# Patient Record
Sex: Male | Born: 1955
Health system: Southern US, Community
[De-identification: ages and names within clinical notes are randomized; demographics above are authoritative.]

## PROBLEM LIST (undated history)

## (undated) DIAGNOSIS — I2510 Atherosclerotic heart disease of native coronary artery without angina pectoris: Secondary | ICD-10-CM

## (undated) DIAGNOSIS — G4733 Obstructive sleep apnea (adult) (pediatric): Secondary | ICD-10-CM

## (undated) DIAGNOSIS — I714 Abdominal aortic aneurysm, without rupture: Secondary | ICD-10-CM

## (undated) DIAGNOSIS — K219 Gastro-esophageal reflux disease without esophagitis: Secondary | ICD-10-CM

## (undated) DIAGNOSIS — K635 Polyp of colon: Secondary | ICD-10-CM

## (undated) DIAGNOSIS — E785 Hyperlipidemia, unspecified: Secondary | ICD-10-CM

## (undated) DIAGNOSIS — L0591 Pilonidal cyst without abscess: Secondary | ICD-10-CM

## (undated) DIAGNOSIS — K449 Diaphragmatic hernia without obstruction or gangrene: Secondary | ICD-10-CM

## (undated) DIAGNOSIS — G4730 Sleep apnea, unspecified: Secondary | ICD-10-CM

## (undated) DIAGNOSIS — E7800 Pure hypercholesterolemia, unspecified: Secondary | ICD-10-CM

## (undated) DIAGNOSIS — F1011 Alcohol abuse, in remission: Secondary | ICD-10-CM

## (undated) DIAGNOSIS — J45909 Unspecified asthma, uncomplicated: Secondary | ICD-10-CM

## (undated) DIAGNOSIS — I701 Atherosclerosis of renal artery: Secondary | ICD-10-CM

## (undated) DIAGNOSIS — F1010 Alcohol abuse, uncomplicated: Secondary | ICD-10-CM

## (undated) HISTORY — DX: Obstructive sleep apnea (adult) (pediatric): G47.33

## (undated) HISTORY — DX: Gastro-esophageal reflux disease without esophagitis: K21.9

## (undated) HISTORY — DX: Hyperlipidemia, unspecified: E78.5

## (undated) HISTORY — DX: Sleep apnea, unspecified: G47.30

## (undated) HISTORY — DX: Polyp of colon: K63.5

## (undated) HISTORY — DX: Abdominal aortic aneurysm, without rupture: I71.4

## (undated) HISTORY — DX: Alcohol abuse, in remission: F10.11

## (undated) HISTORY — DX: Atherosclerotic heart disease of native coronary artery without angina pectoris: I25.10

## (undated) HISTORY — DX: Alcohol abuse, uncomplicated: F10.10

## (undated) HISTORY — DX: Pure hypercholesterolemia, unspecified: E78.00

## (undated) HISTORY — PX: KNEE SURGERY: SHX244

## (undated) HISTORY — DX: Atherosclerosis of renal artery: I70.1

## (undated) HISTORY — DX: Pilonidal cyst without abscess: L05.91

## (undated) HISTORY — DX: Diaphragmatic hernia without obstruction or gangrene: K44.9

---

## 1999-05-31 ENCOUNTER — Emergency Department (HOSPITAL_COMMUNITY): Admission: EM | Admit: 1999-05-31 | Discharge: 1999-05-31 | Payer: Self-pay

## 1999-11-06 ENCOUNTER — Encounter: Payer: Self-pay | Admitting: Family Medicine

## 1999-11-06 ENCOUNTER — Encounter: Admission: RE | Admit: 1999-11-06 | Discharge: 1999-11-06 | Payer: Self-pay | Admitting: Family Medicine

## 2002-05-17 ENCOUNTER — Emergency Department (HOSPITAL_COMMUNITY): Admission: EM | Admit: 2002-05-17 | Discharge: 2002-05-17 | Payer: Self-pay | Admitting: Emergency Medicine

## 2002-07-23 ENCOUNTER — Ambulatory Visit (HOSPITAL_BASED_OUTPATIENT_CLINIC_OR_DEPARTMENT_OTHER): Admission: RE | Admit: 2002-07-23 | Discharge: 2002-07-23 | Payer: Self-pay | Admitting: Orthopedic Surgery

## 2003-09-04 HISTORY — PX: JOINT REPLACEMENT: SHX530

## 2004-01-26 ENCOUNTER — Inpatient Hospital Stay (HOSPITAL_COMMUNITY): Admission: RE | Admit: 2004-01-26 | Discharge: 2004-01-29 | Payer: Self-pay | Admitting: Orthopedic Surgery

## 2013-08-10 ENCOUNTER — Telehealth: Payer: Self-pay | Admitting: *Deleted

## 2013-08-10 NOTE — Telephone Encounter (Signed)
Pt request 2nd pair of orthotics.  Please call.

## 2013-08-18 DIAGNOSIS — M722 Plantar fascial fibromatosis: Secondary | ICD-10-CM

## 2013-08-19 NOTE — Telephone Encounter (Signed)
Pt paid $275.00 for 2nd pair of orthotics on Dec 16th. I ordered them today.

## 2016-11-09 DIAGNOSIS — E7800 Pure hypercholesterolemia, unspecified: Secondary | ICD-10-CM | POA: Diagnosis not present

## 2016-11-09 DIAGNOSIS — Z23 Encounter for immunization: Secondary | ICD-10-CM | POA: Diagnosis not present

## 2016-11-09 DIAGNOSIS — Z0000 Encounter for general adult medical examination without abnormal findings: Secondary | ICD-10-CM | POA: Diagnosis not present

## 2016-11-09 DIAGNOSIS — Z125 Encounter for screening for malignant neoplasm of prostate: Secondary | ICD-10-CM | POA: Diagnosis not present

## 2016-11-09 DIAGNOSIS — L309 Dermatitis, unspecified: Secondary | ICD-10-CM | POA: Diagnosis not present

## 2016-11-12 ENCOUNTER — Other Ambulatory Visit: Payer: Self-pay | Admitting: Acute Care

## 2016-11-12 DIAGNOSIS — F17210 Nicotine dependence, cigarettes, uncomplicated: Secondary | ICD-10-CM

## 2016-11-26 ENCOUNTER — Ambulatory Visit (INDEPENDENT_AMBULATORY_CARE_PROVIDER_SITE_OTHER): Payer: Commercial Managed Care - PPO | Admitting: Acute Care

## 2016-11-26 ENCOUNTER — Encounter: Payer: Self-pay | Admitting: Acute Care

## 2016-11-26 ENCOUNTER — Ambulatory Visit (INDEPENDENT_AMBULATORY_CARE_PROVIDER_SITE_OTHER)
Admission: RE | Admit: 2016-11-26 | Discharge: 2016-11-26 | Disposition: A | Discharge status: Home / Self Care | Payer: Commercial Managed Care - PPO | Source: Ambulatory Visit | Attending: Acute Care | Admitting: Acute Care

## 2016-11-26 DIAGNOSIS — F17210 Nicotine dependence, cigarettes, uncomplicated: Secondary | ICD-10-CM

## 2016-11-26 DIAGNOSIS — Z87891 Personal history of nicotine dependence: Secondary | ICD-10-CM

## 2016-11-26 DIAGNOSIS — Z136 Encounter for screening for cardiovascular disorders: Secondary | ICD-10-CM | POA: Diagnosis not present

## 2016-11-26 DIAGNOSIS — E871 Hypo-osmolality and hyponatremia: Secondary | ICD-10-CM | POA: Diagnosis not present

## 2016-11-26 DIAGNOSIS — E7800 Pure hypercholesterolemia, unspecified: Secondary | ICD-10-CM | POA: Diagnosis not present

## 2016-11-26 NOTE — Progress Notes (Signed)
Shared Decision Making Visit Lung Cancer Screening Program [phone])   Eligibility:  Age 61 y.o.  Pack Years Smoking History Calculation 43-pack-year smoking history (# packs/per year x # years smoked)  Recent History of coughing up blood  no  Unexplained weight loss? no ( >Than 15 pounds within the last 6 months )  Prior History Lung / other cancer no (Diagnosis within the last 5 years already requiring surveillance chest CT Scans).  Smoking Status Current Smoker  Former Smokers: Years since quit:NA  Quit Date: NA  Visit Components:  Discussion included one or more decision making aids. yes  Discussion included risk/benefits of screening. yes  Discussion included potential follow up diagnostic testing for abnormal scans. yes  Discussion included meaning and risk of over diagnosis. yes  Discussion included meaning and risk of False Positives. yes  Discussion included meaning of total radiation exposure. yes  Counseling Included:  Importance of adherence to annual lung cancer LDCT screening. yes  Impact of comorbidities on ability to participate in the program. yes  Ability and willingness to under diagnostic treatment. yes  Smoking Cessation Counseling:  Current Smokers:   Discussed importance of smoking cessation. yes  Information about tobacco cessation classes and interventions provided to patient. yes  Patient provided with "ticket" for LDCT Scan. yes  Symptomatic Patient. no  Counseling  Diagnosis Code: Tobacco Use Z72.0  Asymptomatic Patient yes  Counseling (Intermediate counseling: > three minutes counseling) N2778  Former Smokers:   Discussed the importance of maintaining cigarette abstinence. yes  Diagnosis Code: Personal History of Nicotine Dependence. E42.353  Information about tobacco cessation classes and interventions provided to patient. Yes  Patient provided with "ticket" for LDCT Scan. yes  Written Order for Lung Cancer  Screening with LDCT placed in Epic. Yes (CT Chest Lung Cancer Screening Low Dose W/O CM) [id number] Z12.2-Screening of respiratory organs Z87.891-Personal history of nicotine dependence  I have spent 25 minutes of face to face time with Mr. Hubert discussing the risks and benefits of lung cancer screening. We viewed a power point together that explained in detail the above noted topics. We paused at intervals to allow for questions to be asked and answered to ensure understanding.We discussed that the single most powerful action that he can take to decrease his risk of developing lung cancer is to quit smoking. We discussed whether or not he is ready to commit to setting a quit date. He is currently not ready to set a quit date We discussed options for tools to aid in quitting smoking including nicotine replacement therapy, non-nicotine medications, support groups, Quit Smart classes, and behavior modification. We discussed that often times setting smaller, more achievable goals, such as eliminating 1 cigarette a day for a week and then 2 cigarettes a day for a week can be helpful in slowly decreasing the number of cigarettes smoked. This allows for a sense of accomplishment as well as providing a clinical benefit. I gave him the " Be Stronger Than Your Excuses" card with contact information for community resources, classes, free nicotine replacement therapy, and access to mobile apps, text messaging, and on-line smoking cessation help. I have also given him my card and contact information in the event he  needs to contact me. We discussed the time and location of the scan, and that either Doroteo Glassman, RN or I will call with the results within 24-48 hours of receiving them. I have provided him  with a copy of the power point we viewed  as  a resource in the event they need reinforcement of the concepts we discussed today in the office. The patient verbalized understanding of all of  the above and had no further  questions upon leaving the office. They have my contact information in the event they have any further questions.  I spent 4 minutes counseling patient regarding smoking cessation, and health risks of continued tobacco abuse.  We discussed that there has been a large incidence of coronary artery disease noted on these scans. I explained that we will share the results of the scan with his PCP, so that they can follow up as they feel is clinically indicated. Patient verbalized understanding of the above and had no further questions at completion of the visit.  Magdalen Spatz, NP 11/26/2016

## 2016-11-28 ENCOUNTER — Other Ambulatory Visit: Payer: Self-pay | Admitting: Acute Care

## 2016-11-28 DIAGNOSIS — F17210 Nicotine dependence, cigarettes, uncomplicated: Secondary | ICD-10-CM

## 2016-12-14 DIAGNOSIS — D126 Benign neoplasm of colon, unspecified: Secondary | ICD-10-CM | POA: Diagnosis not present

## 2016-12-14 DIAGNOSIS — Z86010 Personal history of colonic polyps: Secondary | ICD-10-CM | POA: Diagnosis not present

## 2016-12-14 DIAGNOSIS — K635 Polyp of colon: Secondary | ICD-10-CM | POA: Diagnosis not present

## 2016-12-19 DIAGNOSIS — J029 Acute pharyngitis, unspecified: Secondary | ICD-10-CM | POA: Diagnosis not present

## 2016-12-22 DIAGNOSIS — J029 Acute pharyngitis, unspecified: Secondary | ICD-10-CM | POA: Diagnosis not present

## 2016-12-28 ENCOUNTER — Emergency Department (HOSPITAL_COMMUNITY)
Admission: EM | Admit: 2016-12-28 | Discharge: 2016-12-28 | Disposition: A | Discharge status: Home / Self Care | Payer: Commercial Managed Care - PPO | Attending: Emergency Medicine | Admitting: Emergency Medicine

## 2016-12-28 ENCOUNTER — Emergency Department (HOSPITAL_COMMUNITY): Payer: Commercial Managed Care - PPO

## 2016-12-28 ENCOUNTER — Encounter (HOSPITAL_COMMUNITY): Payer: Self-pay | Admitting: Emergency Medicine

## 2016-12-28 DIAGNOSIS — R590 Localized enlarged lymph nodes: Secondary | ICD-10-CM | POA: Diagnosis not present

## 2016-12-28 DIAGNOSIS — F17200 Nicotine dependence, unspecified, uncomplicated: Secondary | ICD-10-CM | POA: Diagnosis not present

## 2016-12-28 DIAGNOSIS — J36 Peritonsillar abscess: Secondary | ICD-10-CM | POA: Insufficient documentation

## 2016-12-28 DIAGNOSIS — E041 Nontoxic single thyroid nodule: Secondary | ICD-10-CM | POA: Diagnosis not present

## 2016-12-28 DIAGNOSIS — Z79899 Other long term (current) drug therapy: Secondary | ICD-10-CM | POA: Diagnosis not present

## 2016-12-28 DIAGNOSIS — J45909 Unspecified asthma, uncomplicated: Secondary | ICD-10-CM | POA: Diagnosis not present

## 2016-12-28 HISTORY — DX: Peritonsillar abscess: J36

## 2016-12-28 HISTORY — DX: Unspecified asthma, uncomplicated: J45.909

## 2016-12-28 LAB — BASIC METABOLIC PANEL
Anion gap: 9 (ref 5–15)
BUN: 15 mg/dL (ref 6–20)
CO2: 29 mmol/L (ref 22–32)
Calcium: 9.6 mg/dL (ref 8.9–10.3)
Chloride: 94 mmol/L — ABNORMAL LOW (ref 101–111)
Creatinine, Ser: 0.79 mg/dL (ref 0.61–1.24)
GFR calc Af Amer: >60 mL/min (ref >60)
GFR calc non Af Amer: >60 mL/min (ref >60)
Glucose, Bld: 92 mg/dL (ref 65–99)
Potassium: 4.1 mmol/L (ref 3.5–5.1)
Sodium: 132 mmol/L — ABNORMAL LOW (ref 135–145)

## 2016-12-28 LAB — CBC WITH DIFFERENTIAL/PLATELET
Basophils Absolute: 0 10*3/uL (ref 0.0–0.1)
Basophils Relative: 0 %
Eosinophils Absolute: 0.1 10*3/uL (ref 0.0–0.7)
Eosinophils Relative: 1 %
HCT: 41.2 % (ref 39.0–52.0)
Hemoglobin: 15 g/dL (ref 13.0–17.0)
Lymphocytes Relative: 19 %
Lymphs Abs: 2.6 10*3/uL (ref 0.7–4.0)
MCH: 33.3 pg (ref 26.0–34.0)
MCHC: 36.4 g/dL — ABNORMAL HIGH (ref 30.0–36.0)
MCV: 91.4 fL (ref 78.0–100.0)
Monocytes Absolute: 1.7 10*3/uL — ABNORMAL HIGH (ref 0.1–1.0)
Monocytes Relative: 12 %
Neutro Abs: 9.8 10*3/uL — ABNORMAL HIGH (ref 1.7–7.7)
Neutrophils Relative %: 68 %
Platelets: 252 10*3/uL (ref 150–400)
RBC: 4.51 MIL/uL (ref 4.22–5.81)
RDW: 13 % (ref 11.5–15.5)
WBC: 14.2 10*3/uL — ABNORMAL HIGH (ref 4.0–10.5)

## 2016-12-28 LAB — RAPID STREP SCREEN (MED CTR MEBANE ONLY): Streptococcus, Group A Screen (Direct): NEGATIVE

## 2016-12-28 MED ORDER — ONDANSETRON HCL 4 MG/2ML IJ SOLN
4.0000 mg | Freq: Once | INTRAMUSCULAR | Status: AC
  Administered 2016-12-28: 4 mg via INTRAVENOUS
  Filled 2016-12-28: qty 2

## 2016-12-28 MED ORDER — CLINDAMYCIN HCL 300 MG PO CAPS: 300.0000 mg | ORAL_CAPSULE | Freq: Three times a day (TID) | ORAL | 0 refills | Status: AC

## 2016-12-28 MED ORDER — IOPAMIDOL (ISOVUE-300) INJECTION 61%
INTRAVENOUS | Status: AC
  Filled 2016-12-28: qty 75

## 2016-12-28 MED ORDER — HYDROCODONE-ACETAMINOPHEN 7.5-325 MG/15ML PO SOLN: 10.0000 mL | ORAL | 0 refills | Status: DC | PRN

## 2016-12-28 MED ORDER — CLINDAMYCIN PHOSPHATE 600 MG/50ML IV SOLN
600.0000 mg | Freq: Once | INTRAVENOUS | Status: AC
  Administered 2016-12-28: 600 mg via INTRAVENOUS
  Filled 2016-12-28: qty 50

## 2016-12-28 MED ORDER — SODIUM CHLORIDE 0.9 % IV BOLUS (SEPSIS)
1000.0000 mL | Freq: Once | INTRAVENOUS | Status: AC
  Administered 2016-12-28: 1000 mL via INTRAVENOUS

## 2016-12-28 MED ORDER — DEXAMETHASONE SODIUM PHOSPHATE 10 MG/ML IJ SOLN
10.0000 mg | Freq: Once | INTRAMUSCULAR | Status: AC
  Administered 2016-12-28: 10 mg via INTRAVENOUS
  Filled 2016-12-28: qty 1

## 2016-12-28 MED ORDER — IOPAMIDOL (ISOVUE-300) INJECTION 61%
75.0000 mL | Freq: Once | INTRAVENOUS | Status: AC | PRN
  Administered 2016-12-28: 75 mL via INTRAVENOUS

## 2016-12-28 MED ORDER — FENTANYL CITRATE (PF) 100 MCG/2ML IJ SOLN
50.0000 ug | Freq: Once | INTRAMUSCULAR | Status: AC
  Administered 2016-12-28: 50 ug via INTRAVENOUS
  Filled 2016-12-28: qty 2

## 2016-12-28 NOTE — ED Triage Notes (Signed)
Pt complaint of continued sore throat for 2 weeks; pt hx of throat abscess. Pt throat notably swollen. Pt continues to verbalizes left ear pain/hearing loss.

## 2016-12-28 NOTE — Discharge Instructions (Signed)
Tonsil Precautions:  1. Increase by mouth fluids (4 L per day). 2. Liquid and soft oral diet as tolerated. 3. Warm salt water gargle as tolerated. 4. Medications as prescribed. 5. Limited physical activity, no lifting or straining. 6. Expect continued improving sore throat, difficulty swallowing and ear pain. 7. Plan follow-up in 2 weeks for recheck. 8. Return if symptoms worsen over the next 48 hours.

## 2016-12-28 NOTE — Consult Note (Signed)
ENT CONSULT:  Reason for Consult: Left sore throat Referring Physician: EDP  Raymond Robinson is an 61 y.o. male.  HPI: Patient presents to the [facility] emergency department with a progressive history of sore throat. Reports a week history of sore throat, fever and left ear pain and fullness. The patient was treated with a ten-day course of clindamycin and steroids with some initial improvement in symptoms. Over 2 days prior to his presentation he developed worsening left ear pain and fullness with trismus.  Past Medical History:  Diagnosis Date  . Asthma     Past Surgical History:  Procedure Laterality Date  . KNEE SURGERY     right    No family history on file.  Social History:  reports that he has been smoking.  He has a 32.25 pack-year smoking history. He has never used smokeless tobacco. He reports that he drinks alcohol. He reports that he does not use drugs.  Allergies:  Allergies  Allergen Reactions  . Penicillins Other (See Comments)    Reaction:  Unknown  Has patient had a PCN reaction causing immediate rash, facial/tongue/throat swelling, SOB or lightheadedness with hypotension: Unsure Has patient had a PCN reaction causing severe rash involving mucus membranes or skin necrosis: Unsure Has patient had a PCN reaction that required hospitalization Unsure Has patient had a PCN reaction occurring within the last 10 years: No If all of the above answers are "NO", then may proceed with Cephalosporin use.    Medications: I have reviewed the patient's current medications.  Results for orders placed or performed during the hospital encounter of 12/28/16 (from the past 48 hour(s))  CBC with Differential     Status: Abnormal   Collection Time: 12/28/16  6:23 PM  Result Value Ref Range   WBC 14.2 (H) 4.0 - 10.5 K/uL   RBC 4.51 4.22 - 5.81 MIL/uL   Hemoglobin 15.0 13.0 - 17.0 g/dL   HCT 41.2 39.0 - 52.0 %   MCV 91.4 78.0 - 100.0 fL   MCH 33.3 26.0 - 34.0 pg   MCHC 36.4 (H) 30.0 - 36.0 g/dL   RDW 13.0 11.5 - 15.5 %   Platelets 252 150 - 400 K/uL   Neutrophils Relative % 68 %   Neutro Abs 9.8 (H) 1.7 - 7.7 K/uL   Lymphocytes Relative 19 %   Lymphs Abs 2.6 0.7 - 4.0 K/uL   Monocytes Relative 12 %   Monocytes Absolute 1.7 (H) 0.1 - 1.0 K/uL   Eosinophils Relative 1 %   Eosinophils Absolute 0.1 0.0 - 0.7 K/uL   Basophils Relative 0 %   Basophils Absolute 0.0 0.0 - 0.1 K/uL  Basic metabolic panel     Status: Abnormal   Collection Time: 12/28/16  6:23 PM  Result Value Ref Range   Sodium 132 (L) 135 - 145 mmol/L   Potassium 4.1 3.5 - 5.1 mmol/L   Chloride 94 (L) 101 - 111 mmol/L   CO2 29 22 - 32 mmol/L   Glucose, Bld 92 65 - 99 mg/dL   BUN 15 6 - 20 mg/dL   Creatinine, Ser 0.79 0.61 - 1.24 mg/dL   Calcium 9.6 8.9 - 10.3 mg/dL   GFR calc non Af Amer >60 >60 mL/min   GFR calc Af Amer >60 >60 mL/min    Comment: (NOTE) The eGFR has been calculated using the CKD EPI equation. This calculation has not been validated in all clinical situations. eGFR's persistently <60 mL/min signify possible Chronic  Kidney Disease.    Anion gap 9 5 - 15  Rapid strep screen (not at [facility])     Status: None   Collection Time: 12/28/16  6:41 PM  Result Value Ref Range   Streptococcus, Group A Screen (Direct) NEGATIVE NEGATIVE    Comment: (NOTE) A Rapid Antigen test may result negative if the antigen level in the sample is below the detection level of this test. The FDA has not cleared this test as a stand-alone test therefore the rapid antigen negative result has reflexed to a Group A Strep culture.     Ct Soft Tissue Neck W Contrast  Result Date: 12/28/2016 CLINICAL DATA:  Initial evaluation for 2 week history of sore throat. EXAM: CT NECK WITH CONTRAST TECHNIQUE: Multidetector CT imaging of the neck was performed using the standard protocol following the bolus administration of intravenous contrast. CONTRAST:  55m ISOVUE-300 IOPAMIDOL (ISOVUE-300) INJECTION  61% COMPARISON:  None. FINDINGS: Pharynx and larynx: Oral cavity within normal limits without mass lesion or loculated fluid collection. No acute abnormality about the remaining dentition. Asymmetric enlargement of the left palatine tonsil. Superimposed hypodense collection measures 2.8 x 3.7 x 4.1 cm (AP by transverse by craniocaudad), consistent with tonsillar/peritonsillar abscess. Additional smaller 8 mm collection slightly inferiorly (series 3, image 49). Few additional tiny microabscesses noted. The enlarged tonsil abuts the uvula medially, which is deviated to the right. Asymmetric fullness extends superiorly towards the right nasopharynx. There is associated induration with inflammatory stranding within the left parapharyngeal fat. Asymmetric mucosal edema within the left oropharynx with involvement of the left retropharyngeal soft tissues. No discrete retropharyngeal abscess or collection. Milder mucosal edema extends inferiorly within the left pharynx towards the base of the left aryepiglottic fold. The right palatine tonsil is within normal limits. Few scattered calcified tonsilliths noted bilaterally. Epiglottis within normal limits. Vallecula is clear. Remainder of the hypopharynx and supraglottic larynx within normal limits. True cords within normal limits. Subglottic airway clear. Salivary glands: Salivary glands including the parotid and submandibular glands are normal. Thyroid: Subcentimeter hypodense nodule noted within the left lobe of thyroid, of doubtful significance. Thyroid otherwise unremarkable. Lymph nodes: Mildly enlarged left level 2 lymph nodes measure up to 12 mm, likely reactive. No other pathologically enlarged lymph nodes identified within the neck. Vascular: Normal intravascular enhancement seen throughout the neck. Vascular calcifications noted about the carotid bifurcations and about the aortic arch. Limited intracranial: Intracranial atherosclerosis noted. Otherwise unremarkable.  Visualized orbits: Visualized globes and orbital soft tissues within normal limits. Mastoids and visualized paranasal sinuses: Retention cyst present within the right maxillary sinus. Paranasal sinuses are otherwise clear. Bilateral mastoid effusions with opacity within the bilateral middle air cavities. Skeleton: No acute osseous abnormality. No worrisome lytic or blastic osseous lesions. Upper chest: Visualized upper mediastinum within normal limits. The visualized lungs are clear. Mild emphysema noted. Other: None. IMPRESSION: 1. 2.8 x 3.7 x 4.1 cm left tonsillar/peritonsillar abscess as above. Additional 8 mm loculated abscess slightly more inferiorly. Mucosal edema within the adjacent left pharynx compatible with associated pharyngitis. 2. Mildly enlarged left level 2 lymph nodes, likely reactive. 3. Emphysema. Electronically Signed   By: BJeannine BogaM.D.   On: 12/28/2016 21:11    ROS:ROS 12 systems reviewed and negative except as stated in HPI   Blood pressure 133/76, pulse 62, temperature 97.8 F (36.6 C), temperature source Oral, resp. rate 18, weight 79.4 kg (175 lb), SpO2 99 %.  PHYSICAL EXAM: General appearance - alert, well appearing, and in no distress  Nose - normal and patent, no erythema, discharge or polyps Mouth - mucous membranes moist, pharynx normal without lesions and Significant left peritonsillar fullness and mild trismus consistent with peritonsillar abscess Neck - tender left cervical lymphadenopathy.   Procedure: Incision and Drainage Left Peritonsillar Abscess  Risks and benefits of incision and drainage of right peritonsillar abscess were discussed in detail with the patient and his family. They understand and agree with the procedure.  Patient treated with topical anesthetic spray. Patient injected with 1 mL of 1% lidocaine with epinephrine in the left peritonsillar fossa. After allowing adequate time for vasoconstriction and anesthesia a 1 cm curvilinear  incision was made in the left peritonsillar fossa. 20 mL of mucopurulent material was expressed. Tonsil hemostat was used to carefully divide loculations and ensure adequate drainage. Minimal bleeding.  Patient tolerated the procedure without complication or difficulty.  Delsa Bern M.D.   Studies Reviewed: CT scan neck showing large left peritonsillar abscess.  Assessment/Plan: Patient presents for evaluation of progressive left sore throat and trismus. Findings on physical exam and CT consistent with peritonsillar abscess. Incision and drainage of peritonsillar abscess performed under topical and local anesthetic without complication. He reports immediate relief of pain and pressure. Recommend additional antibiotics, clindamycin 300 mg 10 days and oral pain medications. Follow tonsil precautions as outlined below.  Tonsil Precautions:  1. Increase by mouth fluids (4 L per day). 2. Liquid and soft oral diet as tolerated. 3. Warm salt water gargle as tolerated. 4. Medications as prescribed. 5. Limited physical activity, no lifting or straining. 6. Expect continued improving sore throat, difficulty swallowing and ear pain. 7. Plan follow-up in 2 weeks for recheck. 8. Return if symptoms worsen over the next 48 hours.  Eschbach, Charline Hoskinson 12/28/2016, 10:48 PM

## 2016-12-30 NOTE — ED Provider Notes (Signed)
[facility] DEPT Provider Note   CSN: [id number] Arrival date & time: 12/28/16  1746     History   Chief Complaint Chief Complaint  Patient presents with  . Abscess    HPI Raymond Robinson is a 61 y.o. male who presents with cc of sore throat. He is a smoker and has a history of 3 previous peritonsillar abscess, the last being 15 years ago. He had onset of sore throat 2 weeks ago, was told by his PCP it was viral, however his sxs continued to worsen. Last week the patient went to an urgent care, feeling that his sxs were the same as his previous  PTA and was started on clindamycin prednisone, with improvement in his sxs until 2 days ago. His sxs suddenly worsened and include Left ear pain, trismus, muffled voice, significant difficulty swallowing, and difficulty breathing due to airway occlusion when he lies flat.  He denies fevers, body aches, chills, weight loss, soaking night sweats.   Abscess    Past Medical History:  Diagnosis Date  . Asthma     Patient Active Problem List   Diagnosis Date Noted  . Peritonsillar abscess 12/28/2016    Past Surgical History:  Procedure Laterality Date  . KNEE SURGERY     right       Home Medications    Prior to Admission medications   Medication Sig Start Date End Date Taking? Authorizing Provider  acetaminophen (TYLENOL) 500 MG tablet Take 1,000 mg by mouth every 6 (six) hours as needed for mild pain, moderate pain, fever or headache.   Yes Historical Provider, MD  atorvastatin (LIPITOR) 20 MG tablet Take 20 mg by mouth at bedtime.   Yes Historical Provider, MD  clindamycin (CLEOCIN) 300 MG capsule Take 1 capsule (300 mg total) by mouth 3 (three) times daily. May open cap and take with liquid 12/28/16 01/07/17  Jerrell Belfast, MD  HYDROcodone-acetaminophen (HYCET) 7.5-325 mg/15 ml solution Take 10-15 mLs by mouth every 4 (four) hours as needed for moderate pain. 12/28/16   Jerrell Belfast, MD    Family History No family history  on file.  Social History Social History  Substance Use Topics  . Smoking status: Current Every Day Smoker    Packs/day: 0.75    Years: 43.00  . Smokeless tobacco: Never Used  . Alcohol use Yes     Allergies   Penicillins   Review of Systems Review of Systems  Ten systems reviewed and are negative for acute change, except as noted in the HPI.   Physical Exam Updated Vital Signs BP 136/76   Pulse 64   Temp 97.8 F (36.6 C) (Oral)   Resp 18   Wt 79.4 kg   SpO2 100%   Physical Exam  Constitutional: He appears well-developed and well-nourished. No distress.  Appears uncomfortable  HENT:  Head: Normocephalic and atraumatic.  Mouth/Throat: No oropharyngeal exudate.    Evaluation of the throat is limited due to swelling ad trismus, however, there is fluctuant swelling extending from the left palatine arch to the left hard pallet. I am unable to visualize the tonsils or post pharynx.  BL TMs normal.  Eyes: Conjunctivae and EOM are normal. Pupils are equal, round, and reactive to light. No scleral icterus.  Neck: Normal range of motion. Neck supple.  Cardiovascular: Normal rate, regular rhythm and normal heart sounds.   Pulmonary/Chest: Effort normal and breath sounds normal. No stridor. No respiratory distress.  Abdominal: Soft. There is no tenderness.  Musculoskeletal:  He exhibits no edema.  Neurological: He is alert.  Skin: Skin is warm and dry. He is not diaphoretic.  Psychiatric: His behavior is normal.  Nursing note and vitals reviewed.    ED Treatments / Results  Labs (all labs ordered are listed, but only abnormal results are displayed) Labs Reviewed  CBC WITH DIFFERENTIAL/PLATELET - Abnormal; Notable for the following:       Result Value   WBC 14.2 (*)    MCHC 36.4 (*)    Neutro Abs 9.8 (*)    Monocytes Absolute 1.7 (*)    All other components within normal limits  BASIC METABOLIC PANEL - Abnormal; Notable for the following:    Sodium 132 (*)     Chloride 94 (*)    All other components within normal limits  RAPID STREP SCREEN (NOT AT [facility])  CULTURE, GROUP A STREP [facility])    EKG  EKG Interpretation None       Radiology Ct Soft Tissue Neck W Contrast  Result Date: 12/28/2016 CLINICAL DATA:  Initial evaluation for 2 week history of sore throat. EXAM: CT NECK WITH CONTRAST TECHNIQUE: Multidetector CT imaging of the neck was performed using the standard protocol following the bolus administration of intravenous contrast. CONTRAST:  57mL ISOVUE-300 IOPAMIDOL (ISOVUE-300) INJECTION 61% COMPARISON:  None. FINDINGS: Pharynx and larynx: Oral cavity within normal limits without mass lesion or loculated fluid collection. No acute abnormality about the remaining dentition. Asymmetric enlargement of the left palatine tonsil. Superimposed hypodense collection measures 2.8 x 3.7 x 4.1 cm (AP by transverse by craniocaudad), consistent with tonsillar/peritonsillar abscess. Additional smaller 8 mm collection slightly inferiorly (series 3, image 49). Few additional tiny microabscesses noted. The enlarged tonsil abuts the uvula medially, which is deviated to the right. Asymmetric fullness extends superiorly towards the right nasopharynx. There is associated induration with inflammatory stranding within the left parapharyngeal fat. Asymmetric mucosal edema within the left oropharynx with involvement of the left retropharyngeal soft tissues. No discrete retropharyngeal abscess or collection. Milder mucosal edema extends inferiorly within the left pharynx towards the base of the left aryepiglottic fold. The right palatine tonsil is within normal limits. Few scattered calcified tonsilliths noted bilaterally. Epiglottis within normal limits. Vallecula is clear. Remainder of the hypopharynx and supraglottic larynx within normal limits. True cords within normal limits. Subglottic airway clear. Salivary glands: Salivary glands including the parotid and submandibular  glands are normal. Thyroid: Subcentimeter hypodense nodule noted within the left lobe of thyroid, of doubtful significance. Thyroid otherwise unremarkable. Lymph nodes: Mildly enlarged left level 2 lymph nodes measure up to 12 mm, likely reactive. No other pathologically enlarged lymph nodes identified within the neck. Vascular: Normal intravascular enhancement seen throughout the neck. Vascular calcifications noted about the carotid bifurcations and about the aortic arch. Limited intracranial: Intracranial atherosclerosis noted. Otherwise unremarkable. Visualized orbits: Visualized globes and orbital soft tissues within normal limits. Mastoids and visualized paranasal sinuses: Retention cyst present within the right maxillary sinus. Paranasal sinuses are otherwise clear. Bilateral mastoid effusions with opacity within the bilateral middle air cavities. Skeleton: No acute osseous abnormality. No worrisome lytic or blastic osseous lesions. Upper chest: Visualized upper mediastinum within normal limits. The visualized lungs are clear. Mild emphysema noted. Other: None. IMPRESSION: 1. 2.8 x 3.7 x 4.1 cm left tonsillar/peritonsillar abscess as above. Additional 8 mm loculated abscess slightly more inferiorly. Mucosal edema within the adjacent left pharynx compatible with associated pharyngitis. 2. Mildly enlarged left level 2 lymph nodes, likely reactive. 3. Emphysema. Electronically Signed  By: Jeannine Boga M.D.   On: 12/28/2016 21:11    Procedures Procedures (including critical care time)  Medications Ordered in ED Medications  dexamethasone (DECADRON) injection 10 mg (10 mg Intravenous Given 12/28/16 1853)  clindamycin (CLEOCIN) IVPB 600 mg (0 mg Intravenous Stopped 12/28/16 1946)  ondansetron (ZOFRAN) injection 4 mg (4 mg Intravenous Given 12/28/16 1922)  fentaNYL (SUBLIMAZE) injection 50 mcg (50 mcg Intravenous Given 12/28/16 1922)  sodium chloride 0.9 % bolus 1,000 mL (0 mLs Intravenous Stopped  12/28/16 2030)  iopamidol (ISOVUE-300) 61 % injection 75 mL (75 mLs Intravenous Contrast Given 12/28/16 2034)     Initial Impression / Assessment and Plan / ED Course  I have reviewed the triage vital signs and the nursing notes.  Pertinent labs & imaging results that were available during my care of the patient were reviewed by me and considered in my medical decision making (see chart for details).  Clinical Course as of Dec 30 1505  Fri Dec 28, 2016  1914 I spoke with Dr. Wilburn Cornelia who asks that we get a ct soft tissue of the neck and call him back with results.  [AH]  2138 Dr. Wilburn Cornelia will come to the ED for abscess drain  [AH]    Clinical Course User Index [AH] Margarita Mail, PA-C    Patient abscess drained by Dr. Wilburn Cornelia here in the ED. He feels much improve. The patient is safe for discharge and will follow up with Dr. Wilburn Cornelia early this coming week.  Final Clinical Impressions(s) / ED Diagnoses   Final diagnoses:  None    New Prescriptions Discharge Medication List as of 12/28/2016 11:08 PM    START taking these medications   Details  HYDROcodone-acetaminophen (HYCET) 7.5-325 mg/15 ml solution Take 10-15 mLs by mouth every 4 (four) hours as needed for moderate pain., Starting Fri 12/28/2016, Print         Margarita Mail, PA-C 12/30/16 2500    Malvin Johns, MD 01/07/17 [phone]

## 2016-12-31 LAB — CULTURE, GROUP A STREP (THRC)

## 2017-01-11 DIAGNOSIS — H6502 Acute serous otitis media, left ear: Secondary | ICD-10-CM

## 2017-01-11 DIAGNOSIS — H6983 Other specified disorders of Eustachian tube, bilateral: Secondary | ICD-10-CM | POA: Diagnosis not present

## 2017-01-11 DIAGNOSIS — H6993 Unspecified Eustachian tube disorder, bilateral: Secondary | ICD-10-CM

## 2017-01-11 DIAGNOSIS — H6123 Impacted cerumen, bilateral: Secondary | ICD-10-CM | POA: Insufficient documentation

## 2017-01-11 DIAGNOSIS — J302 Other seasonal allergic rhinitis: Secondary | ICD-10-CM

## 2017-01-11 DIAGNOSIS — J36 Peritonsillar abscess: Secondary | ICD-10-CM | POA: Insufficient documentation

## 2017-01-11 HISTORY — DX: Other seasonal allergic rhinitis: J30.2

## 2017-01-11 HISTORY — DX: Acute serous otitis media, left ear: H65.02

## 2017-01-11 HISTORY — DX: Unspecified eustachian tube disorder, bilateral: H69.93

## 2017-01-11 HISTORY — DX: Other specified disorders of eustachian tube, bilateral: H69.83

## 2017-02-22 DIAGNOSIS — H903 Sensorineural hearing loss, bilateral: Secondary | ICD-10-CM | POA: Diagnosis not present

## 2017-02-22 DIAGNOSIS — H9313 Tinnitus, bilateral: Secondary | ICD-10-CM | POA: Insufficient documentation

## 2017-02-22 HISTORY — DX: Sensorineural hearing loss, bilateral: H90.3

## 2017-02-22 HISTORY — DX: Tinnitus, bilateral: H93.13

## 2017-06-07 DIAGNOSIS — Z23 Encounter for immunization: Secondary | ICD-10-CM | POA: Diagnosis not present

## 2017-06-11 DIAGNOSIS — L723 Sebaceous cyst: Secondary | ICD-10-CM | POA: Diagnosis not present

## 2017-11-12 DIAGNOSIS — E7800 Pure hypercholesterolemia, unspecified: Secondary | ICD-10-CM | POA: Diagnosis not present

## 2017-11-12 DIAGNOSIS — Z0000 Encounter for general adult medical examination without abnormal findings: Secondary | ICD-10-CM | POA: Diagnosis not present

## 2017-11-12 DIAGNOSIS — Z125 Encounter for screening for malignant neoplasm of prostate: Secondary | ICD-10-CM | POA: Diagnosis not present

## 2017-11-12 DIAGNOSIS — R413 Other amnesia: Secondary | ICD-10-CM | POA: Diagnosis not present

## 2017-11-15 DIAGNOSIS — M7022 Olecranon bursitis, left elbow: Secondary | ICD-10-CM | POA: Diagnosis not present

## 2017-12-02 ENCOUNTER — Ambulatory Visit (INDEPENDENT_AMBULATORY_CARE_PROVIDER_SITE_OTHER)
Admission: RE | Admit: 2017-12-02 | Discharge: 2017-12-02 | Disposition: A | Discharge status: Home / Self Care | Payer: 59 | Source: Ambulatory Visit | Attending: Acute Care | Admitting: Acute Care

## 2017-12-02 DIAGNOSIS — F17210 Nicotine dependence, cigarettes, uncomplicated: Secondary | ICD-10-CM

## 2017-12-02 DIAGNOSIS — Z87891 Personal history of nicotine dependence: Secondary | ICD-10-CM | POA: Diagnosis not present

## 2017-12-05 DIAGNOSIS — M7582 Other shoulder lesions, left shoulder: Secondary | ICD-10-CM | POA: Diagnosis not present

## 2017-12-05 DIAGNOSIS — M7581 Other shoulder lesions, right shoulder: Secondary | ICD-10-CM | POA: Diagnosis not present

## 2017-12-05 DIAGNOSIS — M7022 Olecranon bursitis, left elbow: Secondary | ICD-10-CM | POA: Diagnosis not present

## 2017-12-06 ENCOUNTER — Other Ambulatory Visit: Payer: Self-pay | Admitting: Acute Care

## 2017-12-06 DIAGNOSIS — F17210 Nicotine dependence, cigarettes, uncomplicated: Secondary | ICD-10-CM

## 2017-12-06 DIAGNOSIS — Z122 Encounter for screening for malignant neoplasm of respiratory organs: Secondary | ICD-10-CM

## 2018-02-24 DIAGNOSIS — H9313 Tinnitus, bilateral: Secondary | ICD-10-CM | POA: Diagnosis not present

## 2018-02-24 DIAGNOSIS — H903 Sensorineural hearing loss, bilateral: Secondary | ICD-10-CM | POA: Diagnosis not present

## 2018-03-19 DIAGNOSIS — M810 Age-related osteoporosis without current pathological fracture: Secondary | ICD-10-CM | POA: Diagnosis not present

## 2018-03-28 DIAGNOSIS — M8000XA Age-related osteoporosis with current pathological fracture, unspecified site, initial encounter for fracture: Secondary | ICD-10-CM | POA: Diagnosis not present

## 2018-05-13 DIAGNOSIS — H9042 Sensorineural hearing loss, unilateral, left ear, with unrestricted hearing on the contralateral side: Secondary | ICD-10-CM | POA: Diagnosis not present

## 2018-11-25 DIAGNOSIS — Z1159 Encounter for screening for other viral diseases: Secondary | ICD-10-CM | POA: Diagnosis not present

## 2018-11-25 DIAGNOSIS — Z125 Encounter for screening for malignant neoplasm of prostate: Secondary | ICD-10-CM | POA: Diagnosis not present

## 2018-12-04 ENCOUNTER — Inpatient Hospital Stay: Admission: RE | Admit: 2018-12-04 | Payer: 59 | Source: Ambulatory Visit

## 2018-12-16 ENCOUNTER — Inpatient Hospital Stay: Admission: RE | Admit: 2018-12-16 | Payer: 59 | Source: Ambulatory Visit

## 2019-01-27 ENCOUNTER — Other Ambulatory Visit: Payer: Self-pay | Admitting: Acute Care

## 2019-01-27 DIAGNOSIS — Z122 Encounter for screening for malignant neoplasm of respiratory organs: Secondary | ICD-10-CM

## 2019-01-27 DIAGNOSIS — Z87891 Personal history of nicotine dependence: Secondary | ICD-10-CM

## 2019-01-27 DIAGNOSIS — F17210 Nicotine dependence, cigarettes, uncomplicated: Secondary | ICD-10-CM

## 2019-02-02 ENCOUNTER — Telehealth: Payer: Self-pay | Admitting: *Deleted

## 2019-02-02 NOTE — Telephone Encounter (Signed)

## 2019-02-04 ENCOUNTER — Other Ambulatory Visit: Payer: Self-pay

## 2019-02-04 ENCOUNTER — Ambulatory Visit (INDEPENDENT_AMBULATORY_CARE_PROVIDER_SITE_OTHER)
Admission: RE | Admit: 2019-02-04 | Discharge: 2019-02-04 | Disposition: A | Discharge status: Home / Self Care | Payer: Medicare Other | Source: Ambulatory Visit | Attending: Acute Care | Admitting: Acute Care

## 2019-02-04 ENCOUNTER — Encounter (INDEPENDENT_AMBULATORY_CARE_PROVIDER_SITE_OTHER): Payer: Self-pay

## 2019-02-04 DIAGNOSIS — F17210 Nicotine dependence, cigarettes, uncomplicated: Secondary | ICD-10-CM | POA: Diagnosis not present

## 2019-02-04 DIAGNOSIS — Z87891 Personal history of nicotine dependence: Secondary | ICD-10-CM

## 2019-02-04 DIAGNOSIS — Z122 Encounter for screening for malignant neoplasm of respiratory organs: Secondary | ICD-10-CM

## 2019-02-06 ENCOUNTER — Other Ambulatory Visit: Payer: Self-pay | Admitting: Acute Care

## 2019-02-06 DIAGNOSIS — F17210 Nicotine dependence, cigarettes, uncomplicated: Secondary | ICD-10-CM

## 2019-02-06 DIAGNOSIS — Z87891 Personal history of nicotine dependence: Secondary | ICD-10-CM

## 2019-02-06 DIAGNOSIS — Z122 Encounter for screening for malignant neoplasm of respiratory organs: Secondary | ICD-10-CM

## 2019-04-07 DIAGNOSIS — J039 Acute tonsillitis, unspecified: Secondary | ICD-10-CM | POA: Insufficient documentation

## 2019-04-07 HISTORY — DX: Acute tonsillitis, unspecified: J03.90

## 2019-10-02 ENCOUNTER — Ambulatory Visit: Payer: Medicare Other

## 2019-10-10 ENCOUNTER — Ambulatory Visit: Payer: Medicare Other

## 2019-10-13 ENCOUNTER — Ambulatory Visit: Payer: Medicare Other

## 2020-02-05 ENCOUNTER — Ambulatory Visit
Admission: RE | Admit: 2020-02-05 | Discharge: 2020-02-05 | Disposition: A | Discharge status: Home / Self Care | Payer: Medicare Other | Source: Ambulatory Visit | Attending: Acute Care | Admitting: Acute Care

## 2020-02-05 DIAGNOSIS — Z122 Encounter for screening for malignant neoplasm of respiratory organs: Secondary | ICD-10-CM

## 2020-02-05 DIAGNOSIS — Z87891 Personal history of nicotine dependence: Secondary | ICD-10-CM

## 2020-02-05 DIAGNOSIS — F17210 Nicotine dependence, cigarettes, uncomplicated: Secondary | ICD-10-CM

## 2020-02-11 ENCOUNTER — Telehealth: Payer: Self-pay | Admitting: Acute Care

## 2020-02-11 DIAGNOSIS — F17210 Nicotine dependence, cigarettes, uncomplicated: Secondary | ICD-10-CM

## 2020-02-11 DIAGNOSIS — Z87891 Personal history of nicotine dependence: Secondary | ICD-10-CM

## 2020-02-11 NOTE — Telephone Encounter (Signed)
Pt informed of CT results per Sarah Groce, NP.  PT verbalized understanding.  Copy sent to PCP.  Order placed for 1 yr f/u CT.  

## 2020-02-11 NOTE — Progress Notes (Signed)
Please call patient and let them  know their  low dose Ct was read as a Lung RADS 2: nodules that are benign in appearance and behavior with a very low likelihood of becoming a clinically active cancer due to size or lack of growth. Recommendation per radiology is for a repeat LDCT in 12 months. .Please let them  know we will order and schedule their  annual screening scan for 02/2021. Please let them  know there was notation of CAD on their  scan.  Please remind the patient  that this is a non-gated exam therefore degree or severity of disease  cannot be determined. Please have them  follow up with their PCP regarding potential risk factor modification, dietary therapy or pharmacologic therapy if clinically indicated. Pt.  is  currently on statin therapy. Please place order for annual  screening scan for 02/2021 and fax results to PCP. Thanks so much. 

## 2020-05-18 ENCOUNTER — Other Ambulatory Visit: Payer: Self-pay | Admitting: Family Medicine

## 2020-05-18 DIAGNOSIS — M8000XA Age-related osteoporosis with current pathological fracture, unspecified site, initial encounter for fracture: Secondary | ICD-10-CM

## 2020-06-03 ENCOUNTER — Other Ambulatory Visit: Payer: Self-pay | Admitting: Internal Medicine

## 2020-06-03 ENCOUNTER — Other Ambulatory Visit: Payer: Self-pay | Admitting: Family Medicine

## 2020-06-03 DIAGNOSIS — I714 Abdominal aortic aneurysm, without rupture, unspecified: Secondary | ICD-10-CM

## 2020-06-20 ENCOUNTER — Ambulatory Visit
Admission: RE | Admit: 2020-06-20 | Discharge: 2020-06-20 | Disposition: A | Discharge status: Home / Self Care | Payer: Medicare Other | Source: Ambulatory Visit | Attending: Family Medicine | Admitting: Family Medicine

## 2020-06-20 DIAGNOSIS — I714 Abdominal aortic aneurysm, without rupture, unspecified: Secondary | ICD-10-CM

## 2020-06-20 MED ORDER — IOPAMIDOL (ISOVUE-370) INJECTION 76%
75.0000 mL | Freq: Once | INTRAVENOUS | Status: AC | PRN
Start: 2020-06-20 — End: 2020-06-20
  Administered 2020-06-20: 75 mL via INTRAVENOUS

## 2020-07-06 ENCOUNTER — Encounter: Payer: Self-pay | Admitting: *Deleted

## 2020-07-06 DIAGNOSIS — I701 Atherosclerosis of renal artery: Secondary | ICD-10-CM

## 2020-07-06 DIAGNOSIS — I723 Aneurysm of iliac artery: Secondary | ICD-10-CM

## 2020-07-06 DIAGNOSIS — F17210 Nicotine dependence, cigarettes, uncomplicated: Secondary | ICD-10-CM

## 2020-07-06 DIAGNOSIS — I714 Abdominal aortic aneurysm, without rupture, unspecified: Secondary | ICD-10-CM

## 2020-07-06 DIAGNOSIS — Z860100 Personal history of colon polyps, unspecified: Secondary | ICD-10-CM

## 2020-07-06 DIAGNOSIS — R413 Other amnesia: Secondary | ICD-10-CM

## 2020-07-06 HISTORY — DX: Personal history of colon polyps, unspecified: Z86.0100

## 2020-07-06 HISTORY — DX: Abdominal aortic aneurysm, without rupture, unspecified: I71.40

## 2020-07-06 HISTORY — DX: Personal history of colonic polyps: Z86.010

## 2020-07-06 HISTORY — DX: Nicotine dependence, cigarettes, uncomplicated: F17.210

## 2020-07-06 HISTORY — DX: Atherosclerosis of renal artery: I70.1

## 2020-07-06 HISTORY — DX: Aneurysm of iliac artery: I72.3

## 2020-07-19 ENCOUNTER — Ambulatory Visit (INDEPENDENT_AMBULATORY_CARE_PROVIDER_SITE_OTHER): Payer: Medicare Other | Admitting: Vascular Surgery

## 2020-07-19 ENCOUNTER — Other Ambulatory Visit: Payer: Self-pay | Admitting: *Deleted

## 2020-07-19 ENCOUNTER — Ambulatory Visit (INDEPENDENT_AMBULATORY_CARE_PROVIDER_SITE_OTHER)
Admission: RE | Admit: 2020-07-19 | Discharge: 2020-07-19 | Disposition: A | Discharge status: Home / Self Care | Payer: Medicare Other | Source: Ambulatory Visit | Attending: Vascular Surgery | Admitting: Vascular Surgery

## 2020-07-19 ENCOUNTER — Ambulatory Visit (HOSPITAL_COMMUNITY)
Admission: RE | Admit: 2020-07-19 | Discharge: 2020-07-19 | Disposition: A | Discharge status: Home / Self Care | Payer: Medicare Other | Source: Ambulatory Visit | Attending: Vascular Surgery | Admitting: Vascular Surgery

## 2020-07-19 ENCOUNTER — Other Ambulatory Visit: Payer: Self-pay

## 2020-07-19 ENCOUNTER — Encounter: Payer: Self-pay | Admitting: Vascular Surgery

## 2020-07-19 VITALS — BP 118/73 | HR 57 | Temp 98.1°F | Resp 20 | Ht 75.0 in | Wt 186.0 lb

## 2020-07-19 DIAGNOSIS — I714 Abdominal aortic aneurysm, without rupture, unspecified: Secondary | ICD-10-CM

## 2020-07-19 DIAGNOSIS — I723 Aneurysm of iliac artery: Secondary | ICD-10-CM

## 2020-07-19 NOTE — Progress Notes (Signed)
ASSESSMENT & PLAN:  64 y.o. male with small abdominal aortic aneurysm (32mm), left common iliac artery aneurysm (2mm), right common iliac artery aneurysm (11mm). Incidental right renal artery stenosis (~60%) without difficult to control hypertension or renal dysfunction.   None of the aneurysms are at the traditional threshold for repair. Will plan surveillance with repeat duplex US in 6 months because of how close the RCIAA is to threshold (93mm).   Popliteal artery duplex performed today in clinic shows no evidence of aneurysm.  For atherosclerosis, recommend the following to reduce the risk of adverse cardiac/cerebrovascular/peripheral vascular events:  Complete cessation from all tobacco products. Blood glucose control with goal A1c < 7%. Blood pressure control with goal blood pressure < 140/90 mmHg. Lipid reduction therapy with goal LDL-C <100 mg/dL (<70 if symptomatic from PAD).  Aspirin 81mg  PO QD.  Atorvastatin 40-80mg  PO QD (or other "high intensity" statin therapy).  CHIEF COMPLAINT:   Incidental discovery of aneurysm  HISTORY:  HISTORY OF PRESENT ILLNESS: Raymond Robinson is a 64 y.o. male referred to clinic for evaluation of incidental discovery of asymptomatic infrarenal abdominal aortic and bilateral common iliac artery aneurysms.  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  He is a lifelong smoker.  He is try to quit previously, but not had much success.  He is undergoing low-dose chest CT scanning for lung nodule surveillance.  He has no complaints today.  He has multiple excellent questions about surveillance and repair.  Past Medical History:  Diagnosis Date   Acid reflux    Asthma    Colon polyps    Hiatal hernia    Pilonidal cyst    Sleep apnea     Past Surgical History:  Procedure Laterality Date   JOINT REPLACEMENT Right 2005   Knee   KNEE SURGERY     right    No family history on file.  Social History   Socioeconomic History    Marital status: Married    Spouse name: Not on file   Number of children: Not on file   Years of education: Not on file   Highest education level: Not on file  Occupational History   Not on file  Tobacco Use   Smoking status: Current Every Day Smoker    Packs/day: 0.75    Years: 43.00    Pack years: 32.25   Smokeless tobacco: Never Used  Substance and Sexual Activity   Alcohol use: Yes   Drug use: No   Sexual activity: Not on file  Other Topics Concern   Not on file  Social History Narrative   Not on file   Social Determinants of Health   Financial Resource Strain:    Difficulty of Paying Living Expenses: Not on file  Food Insecurity:    Worried About [place] in the Last Year: Not on file   Ran Out of Food in the Last Year: Not on file  Transportation Needs:    Lack of Transportation (Medical): Not on file   Lack of Transportation (Non-Medical): Not on file  Physical Activity:    Days of Exercise per Week: Not on file   Minutes of Exercise per Session: Not on file  Stress:    Feeling of Stress : Not on file  Social Connections:    Frequency of Communication with Friends and Family: Not on file   Frequency of Social Gatherings with Friends and Family: Not on file   Attends Religious  Services: Not on file   Active Member of Clubs or Organizations: Not on file   Attends Archivist Meetings: Not on file   Marital Status: Not on file  Intimate Partner Violence:    Fear of Current or Ex-Partner: Not on file   Emotionally Abused: Not on file   Physically Abused: Not on file   Sexually Abused: Not on file    Allergies  Allergen Reactions   Penicillins Other (See Comments)    Reaction:  Unknown  Has patient had a PCN reaction causing immediate rash, facial/tongue/throat swelling, SOB or lightheadedness with hypotension: Unsure Has patient had a PCN reaction causing severe rash involving mucus membranes or skin  necrosis: Unsure Has patient had a PCN reaction that required hospitalization Unsure Has patient had a PCN reaction occurring within the last 10 years: No If all of the above answers are "NO", then may proceed with Cephalosporin use. Reaction:Unknown  Has patient had a PCN reaction causing immediate rash, facial/tongue/throat swelling, SOB or lightheadedness with hypotension: Unsure Has patient had a PCN reaction causing severe rash involving mucus membranes or skin necrosis: Unsure Has patient had a PCN reaction that required hospitalization Unsure Has patient had a PCN reaction occurring within the last 10 years: No If all of the above answers are "NO", then may proceed with Cephalosporin use.    Current Outpatient Medications  Medication Sig Dispense Refill   acetaminophen (TYLENOL) 500 MG tablet Take 1,000 mg by mouth every 6 (six) hours as needed for mild pain, moderate pain, fever or headache.     alendronate (FOSAMAX) 70 MG tablet      atorvastatin (LIPITOR) 20 MG tablet Take 20 mg by mouth at bedtime.     clotrimazole-betamethasone (LOTRISONE) cream      fluticasone (FLONASE) 50 MCG/ACT nasal spray Place into the nose.     HYDROcodone-acetaminophen (HYCET) 7.5-325 mg/15 ml solution Take 10-15 mLs by mouth every 4 (four) hours as needed for moderate pain. 200 mL 0   valACYclovir (VALTREX) 1000 MG tablet Take by mouth.     No current facility-administered medications for this visit.    REVIEW OF SYSTEMS:  [X]  denotes positive finding, [ ]  denotes negative finding Cardiac  Comments:  Chest pain or chest pressure:    Shortness of breath upon exertion:    Short of breath when lying flat:    Irregular heart rhythm:        Vascular    Pain in calf, thigh, or hip brought on by ambulation:    Pain in feet at night that wakes you up from your sleep:     Blood clot in your veins:    Leg swelling:         Pulmonary    Oxygen at home:    Productive cough:     Wheezing:          Neurologic    Sudden weakness in arms or legs:     Sudden numbness in arms or legs:     Sudden onset of difficulty speaking or slurred speech:    Temporary loss of vision in one eye:     Problems with dizziness:         Gastrointestinal    Blood in stool:     Vomited blood:         Genitourinary    Burning when urinating:     Blood in urine:        Psychiatric    Major  depression:         Hematologic    Bleeding problems:    Problems with blood clotting too easily:        Skin    Rashes or ulcers:        Constitutional    Fever or chills:     PHYSICAL EXAM:   Vitals:   07/19/20 0824  BP: 118/73  Pulse: (!) 57  Resp: 20  Temp: 98.1 F (36.7 C)  SpO2: 98%  Weight: 186 lb (84.4 kg)  Height: 6\' 3"  (1.905 m)   Constitutional: Well appearing in no distress. Appears well nourished.  Neurologic: Normal gait and station. CN intact.  No weakness.  No sensory loss. Psychiatric: Mood and affect symmetric and appropriate. Eyes: No icterus.  No conjunctival pallor. Ears, nose, throat: mucous membranes moist.  Midline trachea.  Cardiac: regular rate and rhythm.  No murmurs / gallops / rubs. Respiratory: clear to auscultation bilaterally.  No wheezes / rales / rhonchi. Abdominal: soft, non-tender, non-distended.  No palpable pulsatile abdominal mass. Peripheral vascular:  Popliteal pulse: L 1+/ R prominent 2+  Dorsalis pedis pulse: L absent/ R 2+ Extremity: No edema.  No cyanosis.  No pallor.  Rubor noted in bilateral feet with a dependent position. Skin: No gangrene.  No ulceration.  No hair noted about the ankles and feet bilaterally. Lymphatic: No Stemmer's sign.  No palpable lymphadenopathy.  DATA REVIEW:    Most recent CBC CBC Latest Ref Rng & Units 12/28/2016  WBC 4.0 - 10.5 K/uL 14.2(H)  Hemoglobin 13.0 - 17.0 g/dL 15.0  Hematocrit 39 - 52 % 41.2  Platelets 150 - 400 K/uL 252     Most recent CMP CMP Latest Ref Rng & Units 12/28/2016  Glucose 65 - 99  mg/dL 92  BUN 6 - 20 mg/dL 15  Creatinine 0.61 - 1.24 mg/dL 0.79  Sodium 135 - 145 mmol/L 132(L)  Potassium 3.5 - 5.1 mmol/L 4.1  Chloride 101 - 111 mmol/L 94(L)  CO2 22 - 32 mmol/L 29  Calcium 8.9 - 10.3 mg/dL 9.6    Renal function CrCl cannot be calculated (Patient's most recent lab result is older than the maximum 21 days allowed.).  No results found for: HGBA1C  No results found for: LDLCALC, LDLC, HIRISKLDL, POCLDL, LDLDIRECT, REALLDLC, TOTLDLC   Vascular Imaging: CTA personally reviewed Small infrarenal abdominal aortic aneurysm measuring 4mm in greatest dimension Left common iliac artery aneurysm measuring 60mm Right common iliac artery ectasia measuring 57mm Mild right renal artery stenosis Evidence of calcific atherosclerotic disease throughout  [employer]. Stanford Breed, MD [facility] Phone Number: [phone] 07/19/2020 8:18 AM

## 2020-08-11 ENCOUNTER — Other Ambulatory Visit: Payer: Medicare Other

## 2020-08-29 ENCOUNTER — Ambulatory Visit
Admission: RE | Admit: 2020-08-29 | Discharge: 2020-08-29 | Disposition: A | Discharge status: Home / Self Care | Payer: Medicare Other | Source: Ambulatory Visit | Attending: Family Medicine | Admitting: Family Medicine

## 2020-08-29 ENCOUNTER — Other Ambulatory Visit: Payer: Self-pay

## 2020-08-29 DIAGNOSIS — M8000XA Age-related osteoporosis with current pathological fracture, unspecified site, initial encounter for fracture: Secondary | ICD-10-CM

## 2020-09-15 ENCOUNTER — Other Ambulatory Visit: Payer: Medicare Other

## 2021-01-06 ENCOUNTER — Other Ambulatory Visit: Payer: Self-pay

## 2021-01-06 DIAGNOSIS — I723 Aneurysm of iliac artery: Secondary | ICD-10-CM

## 2021-01-06 DIAGNOSIS — I714 Abdominal aortic aneurysm, without rupture, unspecified: Secondary | ICD-10-CM

## 2021-01-26 ENCOUNTER — Other Ambulatory Visit: Payer: Self-pay | Admitting: Vascular Surgery

## 2021-01-26 DIAGNOSIS — I714 Abdominal aortic aneurysm, without rupture, unspecified: Secondary | ICD-10-CM

## 2021-01-26 DIAGNOSIS — I723 Aneurysm of iliac artery: Secondary | ICD-10-CM

## 2021-01-31 ENCOUNTER — Other Ambulatory Visit (HOSPITAL_COMMUNITY): Payer: Medicare Other

## 2021-01-31 ENCOUNTER — Encounter: Payer: Self-pay | Admitting: Vascular Surgery

## 2021-01-31 ENCOUNTER — Ambulatory Visit (HOSPITAL_COMMUNITY)
Admission: RE | Admit: 2021-01-31 | Discharge: 2021-01-31 | Disposition: A | Discharge status: Home / Self Care | Payer: Medicare Other | Source: Ambulatory Visit | Attending: Vascular Surgery | Admitting: Vascular Surgery

## 2021-01-31 ENCOUNTER — Ambulatory Visit (INDEPENDENT_AMBULATORY_CARE_PROVIDER_SITE_OTHER): Payer: Medicare Other | Admitting: Vascular Surgery

## 2021-01-31 ENCOUNTER — Other Ambulatory Visit: Payer: Self-pay

## 2021-01-31 VITALS — BP 113/68 | HR 72 | Temp 97.9°F | Resp 20 | Ht 75.0 in | Wt 169.9 lb

## 2021-01-31 DIAGNOSIS — I714 Abdominal aortic aneurysm, without rupture, unspecified: Secondary | ICD-10-CM

## 2021-01-31 DIAGNOSIS — I723 Aneurysm of iliac artery: Secondary | ICD-10-CM

## 2021-01-31 NOTE — Progress Notes (Signed)
ASSESSMENT & PLAN:  65 y.o. male with small abdominal aortic aneurysm (44mm), left common iliac artery aneurysm (21mm), right common iliac artery aneurysm (84mm). Incidental right renal artery stenosis (~60%) without difficult to control hypertension or renal dysfunction.   None of the aneurysms are at the traditional threshold for repair. Will plan surveillance with repeat duplex US in 12 months because of how close the RCIAA is to threshold.  Popliteal artery duplex performed today in clinic shows no evidence of aneurysm.  For atherosclerosis, recommend the following to reduce the risk of adverse cardiac/cerebrovascular/peripheral vascular events:  Complete cessation from all tobacco products. Blood glucose control with goal A1c < 7%. Blood pressure control with goal blood pressure < 140/90 mmHg. Lipid reduction therapy with goal LDL-C <100 mg/dL (<70 if symptomatic from PAD).  Aspirin 81mg  PO QD.  Atorvastatin 40-80mg  PO QD (or other "high intensity" statin therapy).  CHIEF COMPLAINT:   Incidental discovery of aneurysm  HISTORY:  HISTORY OF PRESENT ILLNESS: Raymond Robinson is a 65 y.o. male referred to clinic for evaluation of incidental discovery of asymptomatic infrarenal abdominal aortic and bilateral common iliac artery aneurysms.  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  He is a lifelong smoker.  He is try to quit previously, but not had much success.  He is undergoing low-dose chest CT scanning for lung nodule surveillance.  He has no complaints today.  He has multiple excellent questions about surveillance and repair.  01/31/21: Doing very well.  No symptoms referable to his abdominal aortic aneurysm.  We again reviewed the natural history of aneurysm disease.  Past Medical History:  Diagnosis Date  . AAA (abdominal aortic aneurysm) ([place])   . Acid reflux   . Asthma   . Colon polyps   . Hiatal hernia   . Hyperlipidemia   . Pilonidal cyst   . Sleep apnea      Past Surgical History:  Procedure Laterality Date  . JOINT REPLACEMENT Right 2005   Knee  . KNEE SURGERY     right    History reviewed. No pertinent family history.  Social History   Socioeconomic History  . Marital status: Married    Spouse name: Not on file  . Number of children: Not on file  . Years of education: Not on file  . Highest education level: Not on file  Occupational History  . Not on file  Tobacco Use  . Smoking status: Current Every Day Smoker    Packs/day: 0.50    Years: 43.00    Pack years: 21.50  . Smokeless tobacco: Never Used  Vaping Use  . Vaping Use: Never used  Substance and Sexual Activity  . Alcohol use: Yes  . Drug use: No  . Sexual activity: Not on file  Other Topics Concern  . Not on file  Social History Narrative  . Not on file   Social Determinants of Health   Financial Resource Strain: Not on file  Food Insecurity: Not on file  Transportation Needs: Not on file  Physical Activity: Not on file  Stress: Not on file  Social Connections: Not on file  Intimate Partner Violence: Not on file    Allergies  Allergen Reactions  . Penicillins Other (See Comments)    Reaction:  Unknown  Has patient had a PCN reaction causing immediate rash, facial/tongue/throat swelling, SOB or lightheadedness with hypotension: Unsure Has patient had a PCN reaction causing severe rash involving mucus membranes or skin  necrosis: Unsure Has patient had a PCN reaction that required hospitalization Unsure Has patient had a PCN reaction occurring within the last 10 years: No If all of the above answers are "NO", then may proceed with Cephalosporin use. Reaction:Unknown  Has patient had a PCN reaction causing immediate rash, facial/tongue/throat swelling, SOB or lightheadedness with hypotension: Unsure Has patient had a PCN reaction causing severe rash involving mucus membranes or skin necrosis: Unsure Has patient had a PCN reaction that required  hospitalization Unsure Has patient had a PCN reaction occurring within the last 10 years: No If all of the above answers are "NO", then may proceed with Cephalosporin use.    Current Outpatient Medications  Medication Sig Dispense Refill  . acetaminophen (TYLENOL) 500 MG tablet Take 1,000 mg by mouth every 6 (six) hours as needed for mild pain, moderate pain, fever or headache.    . alendronate (FOSAMAX) 70 MG tablet     . atorvastatin (LIPITOR) 20 MG tablet Take 20 mg by mouth at bedtime.    . clotrimazole-betamethasone (LOTRISONE) cream     . fluticasone (FLONASE) 50 MCG/ACT nasal spray Place into the nose.    Marland Kitchen HYDROcodone-acetaminophen (HYCET) 7.5-325 mg/15 ml solution Take 10-15 mLs by mouth every 4 (four) hours as needed for moderate pain. 200 mL 0  . valACYclovir (VALTREX) 1000 MG tablet Take by mouth.     No current facility-administered medications for this visit.    REVIEW OF SYSTEMS:  [X]  denotes positive finding, [ ]  denotes negative finding Cardiac  Comments:  Chest pain or chest pressure:    Shortness of breath upon exertion:    Short of breath when lying flat:    Irregular heart rhythm:        Vascular    Pain in calf, thigh, or hip brought on by ambulation:    Pain in feet at night that wakes you up from your sleep:     Blood clot in your veins:    Leg swelling:         Pulmonary    Oxygen at home:    Productive cough:     Wheezing:         Neurologic    Sudden weakness in arms or legs:     Sudden numbness in arms or legs:     Sudden onset of difficulty speaking or slurred speech:    Temporary loss of vision in one eye:     Problems with dizziness:         Gastrointestinal    Blood in stool:     Vomited blood:         Genitourinary    Burning when urinating:     Blood in urine:        Psychiatric    Major depression:         Hematologic    Bleeding problems:    Problems with blood clotting too easily:        Skin    Rashes or ulcers:         Constitutional    Fever or chills:     PHYSICAL EXAM:   Vitals:   01/31/21 0902  BP: 113/68  Pulse: 72  Resp: 20  Temp: 97.9 F (36.6 C)  SpO2: 98%  Weight: 169 lb 14.4 oz (77.1 kg)  Height: 6\' 3"  (1.905 m)   Constitutional: Well appearing in no distress. Appears well nourished.  Neurologic: Normal gait and station. CN intact.  No weakness.  No sensory loss. Psychiatric: Mood and affect symmetric and appropriate. Eyes: No icterus.  No conjunctival pallor. Ears, nose, throat: mucous membranes moist.  Midline trachea.  Cardiac: regular rate and rhythm.  No murmurs / gallops / rubs. Respiratory: clear to auscultation bilaterally.  No wheezes / rales / rhonchi. Abdominal: soft, non-tender, non-distended.  No palpable pulsatile abdominal mass. Peripheral vascular:  Popliteal pulse: L 1+/ R prominent 2+  Dorsalis pedis pulse: L absent/ R 2+ Extremity: No edema.  No cyanosis.  No pallor.  Rubor noted in bilateral feet with a dependent position. Skin: No gangrene.  No ulceration.  No hair noted about the ankles and feet bilaterally. Lymphatic: No Stemmer's sign.  No palpable lymphadenopathy.  DATA REVIEW:    Most recent CBC CBC Latest Ref Rng & Units 12/28/2016  WBC 4.0 - 10.5 K/uL 14.2(H)  Hemoglobin 13.0 - 17.0 g/dL 15.0  Hematocrit 39.0 - 52.0 % 41.2  Platelets 150 - 400 K/uL 252     Most recent CMP CMP Latest Ref Rng & Units 12/28/2016  Glucose 65 - 99 mg/dL 92  BUN 6 - 20 mg/dL 15  Creatinine 0.61 - 1.24 mg/dL 0.79  Sodium 135 - 145 mmol/L 132(L)  Potassium 3.5 - 5.1 mmol/L 4.1  Chloride 101 - 111 mmol/L 94(L)  CO2 22 - 32 mmol/L 29  Calcium 8.9 - 10.3 mg/dL 9.6    Renal function CrCl cannot be calculated (Patient's most recent lab result is older than the maximum 21 days allowed.).  No results found for: HGBA1C  No results found for: LDLCALC, LDLC, HIRISKLDL, POCLDL, LDLDIRECT, REALLDLC, TOTLDLC   Vascular Imaging: CTA personally reviewed Small infrarenal  abdominal aortic aneurysm measuring 36mm in greatest dimension Left common iliac artery aneurysm measuring 75mm Right common iliac artery ectasia measuring 42mm Mild right renal artery stenosis Evidence of calcific atherosclerotic disease throughout  Abdominal aortic aneurysm duplex 02/01/2020 Unchanged appearance of abdominal aortic and common iliac aneurysms.  Yevonne Aline. Stanford Breed, MD [facility] Phone Number: [phone] 01/31/2021 9:14 AM

## 2021-02-06 ENCOUNTER — Ambulatory Visit
Admission: RE | Admit: 2021-02-06 | Discharge: 2021-02-06 | Disposition: A | Discharge status: Home / Self Care | Payer: Medicare Other | Source: Ambulatory Visit | Attending: Acute Care | Admitting: Acute Care

## 2021-02-06 DIAGNOSIS — Z87891 Personal history of nicotine dependence: Secondary | ICD-10-CM

## 2021-02-06 DIAGNOSIS — F17210 Nicotine dependence, cigarettes, uncomplicated: Secondary | ICD-10-CM

## 2021-02-09 NOTE — Progress Notes (Signed)
Please call patient and let them  know their  low dose Ct was read as a Lung RADS 2: nodules that are benign in appearance and behavior with a very low likelihood of becoming a clinically active cancer due to size or lack of growth. Recommendation per radiology is for a repeat LDCT in 12 months. .Please let them  know we will order and schedule their  annual screening scan for 02/2022. Please let them  know there was notation of CAD on their  scan.  Please remind the patient  that this is a non-gated exam therefore degree or severity of disease  cannot be determined. Please have them  follow up with their PCP regarding potential risk factor modification, dietary therapy or pharmacologic therapy if clinically indicated. Pt.  is  currently on statin therapy. Please place order for annual  screening scan for  02/2022 and fax results to PCP. Thanks so much. 

## 2021-02-14 ENCOUNTER — Other Ambulatory Visit: Payer: Self-pay | Admitting: *Deleted

## 2021-02-14 DIAGNOSIS — F17210 Nicotine dependence, cigarettes, uncomplicated: Secondary | ICD-10-CM

## 2021-02-14 DIAGNOSIS — Z87891 Personal history of nicotine dependence: Secondary | ICD-10-CM

## 2021-02-22 DIAGNOSIS — M1711 Unilateral primary osteoarthritis, right knee: Secondary | ICD-10-CM | POA: Diagnosis not present

## 2021-04-27 ENCOUNTER — Other Ambulatory Visit: Payer: Self-pay

## 2021-04-27 ENCOUNTER — Ambulatory Visit (INDEPENDENT_AMBULATORY_CARE_PROVIDER_SITE_OTHER): Payer: Medicare Other | Admitting: Cardiology

## 2021-04-27 VITALS — BP 128/71 | HR 63 | Ht 75.0 in | Wt 173.0 lb

## 2021-04-27 DIAGNOSIS — Z720 Tobacco use: Secondary | ICD-10-CM

## 2021-04-27 DIAGNOSIS — I714 Abdominal aortic aneurysm, without rupture, unspecified: Secondary | ICD-10-CM

## 2021-04-27 DIAGNOSIS — E785 Hyperlipidemia, unspecified: Secondary | ICD-10-CM

## 2021-04-27 DIAGNOSIS — I2510 Atherosclerotic heart disease of native coronary artery without angina pectoris: Secondary | ICD-10-CM

## 2021-04-27 MED ORDER — ASPIRIN EC 81 MG PO TBEC: 81.0000 mg | DELAYED_RELEASE_TABLET | Freq: Every day | ORAL | 3 refills | Status: AC

## 2021-04-27 NOTE — Progress Notes (Signed)
Cardiology Office Note:    Date:  04/27/2021   ID:  Raymond Robinson, DOB Feb 21, 1956, MRN [id number]  PCP:  Lujean Amel, MD  Cardiologist:  None  Electrophysiologist:  None   Referring MD: Lujean Amel, MD   Chief Complaint  Patient presents with   Coronary Artery Disease     History of Present Illness:    Raymond Robinson is a 65 y.o. male with a hx of abdominal aortic aneurysm, hyperlipidemia, OSA, asthma, tobacco use who is referred by Dr. Dorthy Cooler for evaluation of coronary calcification seen on CT. CT chest for lung cancer screening on 02/07/2021 showed significant coronary artery calcifications.  He follows with Dr. Stanford Breed in vascular surgery for abdominal aortic aneurysm (measuring 36 mm), left common iliac artery aneurysm (27 mm), right common iliac artery aneurysm (20 mm).  Also of noted to have right renal artery stenosis without issues with hypertension or renal dysfunction.  Last saw Dr. Stanford Breed May 2022, planned follow-up imaging in 1 year.  Denies any chest pain, dyspnea, lightheadedness, syncope, lower extremity edema, or palpitations.  For exercise, does yard work.  Walks once per week for couple miles.  No exertional symptoms.  Smokes 0.5 ppd, has smoked since age 30.  Reports both his parents had heart issues but he is unsure of the details.   Past Medical History:  Diagnosis Date   AAA (abdominal aortic aneurysm) (HCC)    Acid reflux    Asthma    Colon polyps    Hiatal hernia    Hyperlipidemia    Pilonidal cyst    Sleep apnea     Past Surgical History:  Procedure Laterality Date   JOINT REPLACEMENT Right 2005   Knee   KNEE SURGERY     right    Current Medications: Current Meds  Medication Sig   acetaminophen (TYLENOL) 500 MG tablet Take 1,000 mg by mouth every 6 (six) hours as needed for mild pain, moderate pain, fever or headache.   alendronate (FOSAMAX) 70 MG tablet    aspirin EC 81 MG tablet Take 1 tablet (81 mg total) by mouth daily. Swallow  whole.   atorvastatin (LIPITOR) 20 MG tablet Take 20 mg by mouth at bedtime.   clotrimazole-betamethasone (LOTRISONE) cream    fluticasone (FLONASE) 50 MCG/ACT nasal spray Place into the nose.   valACYclovir (VALTREX) 1000 MG tablet Take by mouth.     Allergies:   Penicillins   Social History   Socioeconomic History   Marital status: Married    Spouse name: Not on file   Number of children: Not on file   Years of education: Not on file   Highest education level: Not on file  Occupational History   Not on file  Tobacco Use   Smoking status: Every Day    Packs/day: 0.50    Years: 43.00    Pack years: 21.50    Types: Cigarettes   Smokeless tobacco: Never  Vaping Use   Vaping Use: Never used  Substance and Sexual Activity   Alcohol use: Yes   Drug use: No   Sexual activity: Not on file  Other Topics Concern   Not on file  Social History Narrative   Not on file   Social Determinants of Health   Financial Resource Strain: Not on file  Food Insecurity: Not on file  Transportation Needs: Not on file  Physical Activity: Not on file  Stress: Not on file  Social Connections: Not on file  Family History: Reports both his parents had heart issues but he is unsure of the details.  ROS:   Please see the history of present illness.     All other systems reviewed and are negative.  EKGs/Labs/Other Studies Reviewed:    The following studies were reviewed today:   EKG:  EKG is  ordered today.  The ekg ordered today demonstrates sinus rhythm, rate 63, PACs  Recent Labs: No results found for requested labs within last 8760 hours.  Recent Lipid Panel No results found for: CHOL, TRIG, HDL, CHOLHDL, VLDL, LDLCALC, LDLDIRECT  Physical Exam:    VS:  BP 128/71   Pulse 63   Ht '6\' 3"'$  (1.905 m)   Wt 173 lb (78.5 kg)   SpO2 99%   BMI 21.62 kg/m     Wt Readings from Last 3 Encounters:  04/27/21 173 lb (78.5 kg)  01/31/21 169 lb 14.4 oz (77.1 kg)  07/19/20 186 lb  (84.4 kg)     GEN:  Well nourished, well developed in no acute distress HEENT: Normal NECK: No JVD; No carotid bruits LYMPHATICS: No lymphadenopathy CARDIAC: RRR, no murmurs, rubs, gallops RESPIRATORY:  Clear to auscultation without rales, wheezing or rhonchi  ABDOMEN: Soft, non-tender, non-distended MUSCULOSKELETAL:  No edema; No deformity  SKIN: Warm and dry NEUROLOGIC:  Alert and oriented x 3 PSYCHIATRIC:  Normal affect   ASSESSMENT:    1. CAD in native artery   2. AAA (abdominal aortic aneurysm) without rupture ([place])   3. Tobacco use   4. Hyperlipidemia, unspecified hyperlipidemia type    PLAN:    CAD: CT chest for lung cancer screening on 02/07/2021 showed coronary artery calcifications.  Will quantify calcifications with calcium score.  He is asymptomatic but if calcium score above 400, will plan stress test -Continue atorvastatin 20 mg daily.  LDL 63, at goal less than 70 -Start aspirin 81 mg daily  AAA: He follows with Dr. Stanford Breed in vascular surgery for abdominal aortic aneurysm (measuring 36 mm), left common iliac artery aneurysm (27 mm), right common iliac artery aneurysm (20 mm).  Also of noted to have right renal artery stenosis without issues with hypertension or renal dysfunction.  Last saw Dr. Stanford Breed May 2022, planned follow-up imaging in 1 year.  Tobacco use: Patient counseled on risks of smoking and cessation strongly encouraged.  Will ask our care guide to reach out to patient to assist in smoking cessation  Hyperlipidemia: LDL 63 on 05/16/2020.  Continue atorvastatin 20 mg daily  OSA: Reports prior diagnosis but unable to tolerate CPAP.  Subsequently lost 80 to 90 pounds, so may no longer be an issue  RTC in 6 months     Medication Adjustments/Labs and Tests Ordered: Current medicines are reviewed at length with the patient today.  Concerns regarding medicines are outlined above.  Orders Placed This Encounter  Procedures   CT CARDIAC SCORING (SELF PAY  ONLY)   EKG 12-Lead   Meds ordered this encounter  Medications   aspirin EC 81 MG tablet    Sig: Take 1 tablet (81 mg total) by mouth daily. Swallow whole.    Dispense:  90 tablet    Refill:  3    Patient Instructions  Medication Instructions:  START aspirin 81 mg daily  *If you need a refill on your cardiac medications before your next appointment, please call your pharmacy*   Testing/Procedures: CT coronary calcium score. This test is done at [address]. This is $99  out of pocket.   Coronary CalciumScan A coronary calcium scan is an imaging test used to look for deposits of calcium and other fatty materials (plaques) in the inner lining of the blood vessels of the heart (coronary arteries). These deposits of calcium and plaques can partly clog and narrow the coronary arteries without producing any symptoms or warning signs. This puts a person at risk for a heart attack. This test can detect these deposits before symptoms develop. Tell a health care provider about: Any allergies you have. All medicines you are taking, including vitamins, herbs, eye drops, creams, and over-the-counter medicines. Any problems you or family members have had with anesthetic medicines. Any blood disorders you have. Any surgeries you have had. Any medical conditions you have. Whether you are pregnant or may be pregnant. What are the risks? Generally, this is a safe procedure. However, problems may occur, including: Harm to a pregnant woman and her unborn baby. This test involves the use of radiation. Radiation exposure can be dangerous to a pregnant woman and her unborn baby. If you are pregnant, you generally should not have this procedure done. Slight increase in the risk of cancer. This is because of the radiation involved in the test. What happens before the procedure? No preparation is needed for this procedure. What happens during the procedure? You will undress and remove  any jewelry around your neck or chest. You will put on a hospital gown. Sticky electrodes will be placed on your chest. The electrodes will be connected to an electrocardiogram (ECG) machine to record a tracing of the electrical activity of your heart. A CT scanner will take pictures of your heart. During this time, you will be asked to lie still and hold your breath for 2-3 seconds while a picture of your heart is being taken. The procedure may vary among health care providers and hospitals. What happens after the procedure? You can get dressed. You can return to your normal activities. It is up to you to get the results of your test. Ask your health care provider, or the department that is doing the test, when your results will be ready. Summary A coronary calcium scan is an imaging test used to look for deposits of calcium and other fatty materials (plaques) in the inner lining of the blood vessels of the heart (coronary arteries). Generally, this is a safe procedure. Tell your health care provider if you are pregnant or may be pregnant. No preparation is needed for this procedure. A CT scanner will take pictures of your heart. You can return to your normal activities after the scan is done. This information is not intended to replace advice given to you by your health care provider. Make sure you discuss any questions you have with your health care provider. Document Released: 02/16/2008 Document Revised: 07/09/2016 Document Reviewed: 07/09/2016 Elsevier Interactive Patient Education  2017 [place]: At [facility], you and your health needs are our priority.  As part of our continuing mission to provide you with exceptional heart care, we have created designated Provider Care Teams.  These Care Teams include your primary Cardiologist (physician) and Advanced Practice Providers (APPs -  Physician Assistants and Nurse Practitioners) who all work together to provide you  with the care you need, when you need it.  We recommend signing up for the patient portal called "MyChart".  Sign up information is provided on this After Visit Summary.  MyChart is used to connect with patients  for Virtual Visits (Telemedicine).  Patients are able to view lab/test results, encounter notes, upcoming appointments, etc.  Non-urgent messages can be sent to your provider as well.   To learn more about what you can do with MyChart, go to [email or web address].    Your next appointment:   6 month(s)  The format for your next appointment:   In Person  Provider:   Oswaldo Milian, MD   You have been referred to: Careguide to assist with smoking cessation     Signed, Donato Heinz, MD  04/27/2021 5:40 PM    [place]

## 2021-04-27 NOTE — Patient Instructions (Signed)
Medication Instructions:  START aspirin 81 mg daily  *If you need a refill on your cardiac medications before your next appointment, please call your pharmacy*   Testing/Procedures: CT coronary calcium score. This test is done at [address]. This is $99 out of pocket.   Coronary CalciumScan A coronary calcium scan is an imaging test used to look for deposits of calcium and other fatty materials (plaques) in the inner lining of the blood vessels of the heart (coronary arteries). These deposits of calcium and plaques can partly clog and narrow the coronary arteries without producing any symptoms or warning signs. This puts a person at risk for a heart attack. This test can detect these deposits before symptoms develop. Tell a health care provider about: Any allergies you have. All medicines you are taking, including vitamins, herbs, eye drops, creams, and over-the-counter medicines. Any problems you or family members have had with anesthetic medicines. Any blood disorders you have. Any surgeries you have had. Any medical conditions you have. Whether you are pregnant or may be pregnant. What are the risks? Generally, this is a safe procedure. However, problems may occur, including: Harm to a pregnant woman and her unborn baby. This test involves the use of radiation. Radiation exposure can be dangerous to a pregnant woman and her unborn baby. If you are pregnant, you generally should not have this procedure done. Slight increase in the risk of cancer. This is because of the radiation involved in the test. What happens before the procedure? No preparation is needed for this procedure. What happens during the procedure? You will undress and remove any jewelry around your neck or chest. You will put on a hospital gown. Sticky electrodes will be placed on your chest. The electrodes will be connected to an electrocardiogram (ECG) machine to record a tracing of the electrical  activity of your heart. A CT scanner will take pictures of your heart. During this time, you will be asked to lie still and hold your breath for 2-3 seconds while a picture of your heart is being taken. The procedure may vary among health care providers and hospitals. What happens after the procedure? You can get dressed. You can return to your normal activities. It is up to you to get the results of your test. Ask your health care provider, or the department that is doing the test, when your results will be ready. Summary A coronary calcium scan is an imaging test used to look for deposits of calcium and other fatty materials (plaques) in the inner lining of the blood vessels of the heart (coronary arteries). Generally, this is a safe procedure. Tell your health care provider if you are pregnant or may be pregnant. No preparation is needed for this procedure. A CT scanner will take pictures of your heart. You can return to your normal activities after the scan is done. This information is not intended to replace advice given to you by your health care provider. Make sure you discuss any questions you have with your health care provider. Document Released: 02/16/2008 Document Revised: 07/09/2016 Document Reviewed: 07/09/2016 Elsevier Interactive Patient Education  2017 [place]: At [facility], you and your health needs are our priority.  As part of our continuing mission to provide you with exceptional heart care, we have created designated Provider Care Teams.  These Care Teams include your primary Cardiologist (physician) and Advanced Practice Providers (APPs -  Physician Assistants and Nurse Practitioners)  who all work together to provide you with the care you need, when you need it.  We recommend signing up for the patient portal called "MyChart".  Sign up information is provided on this After Visit Summary.  MyChart is used to connect with patients for Virtual Visits  (Telemedicine).  Patients are able to view lab/test results, encounter notes, upcoming appointments, etc.  Non-urgent messages can be sent to your provider as well.   To learn more about what you can do with MyChart, go to [email or web address].    Your next appointment:   6 month(s)  The format for your next appointment:   In Person  Provider:   Oswaldo Milian, MD   You have been referred to: Careguide to assist with smoking cessation

## 2021-04-28 ENCOUNTER — Telehealth: Payer: Self-pay

## 2021-04-28 DIAGNOSIS — Z0000 Encounter for general adult medical examination without abnormal findings: Secondary | ICD-10-CM

## 2021-04-28 NOTE — Telephone Encounter (Signed)
Called patient to discuss health coaching for smoking cessation per referral from Dr. Gardiner Rhyme. Left patient a message to return call to Care Guide at [phone].

## 2021-05-01 ENCOUNTER — Telehealth: Payer: Self-pay

## 2021-05-01 DIAGNOSIS — Z0000 Encounter for general adult medical examination without abnormal findings: Secondary | ICD-10-CM

## 2021-05-01 NOTE — Telephone Encounter (Signed)
Returned patient's call regarding his interest in health coaching for smoking cessation. Left patient a message to return phone call to Care Guide at [phone].

## 2021-05-03 ENCOUNTER — Telehealth: Payer: Self-pay

## 2021-05-03 DIAGNOSIS — Z0000 Encounter for general adult medical examination without abnormal findings: Secondary | ICD-10-CM

## 2021-05-03 NOTE — Telephone Encounter (Signed)
Patient called in to inquire about health coaching for smoking cessation. Patient explained that he going out of the country for a few weeks and will call back to schedule an appointment when he returns. Patient shared that he is currently smoking an average of .5 pack of cigarettes per day and stated that he need to quit.

## 2021-05-24 DIAGNOSIS — N400 Enlarged prostate without lower urinary tract symptoms: Secondary | ICD-10-CM | POA: Diagnosis not present

## 2021-05-30 ENCOUNTER — Encounter: Payer: Self-pay | Admitting: Physician Assistant

## 2021-05-30 DIAGNOSIS — I714 Abdominal aortic aneurysm, without rupture: Secondary | ICD-10-CM | POA: Diagnosis not present

## 2021-05-30 DIAGNOSIS — I2510 Atherosclerotic heart disease of native coronary artery without angina pectoris: Secondary | ICD-10-CM | POA: Diagnosis not present

## 2021-05-30 DIAGNOSIS — Z23 Encounter for immunization: Secondary | ICD-10-CM | POA: Diagnosis not present

## 2021-05-30 DIAGNOSIS — I723 Aneurysm of iliac artery: Secondary | ICD-10-CM | POA: Diagnosis not present

## 2021-05-30 DIAGNOSIS — R413 Other amnesia: Secondary | ICD-10-CM | POA: Diagnosis not present

## 2021-05-30 DIAGNOSIS — Z79899 Other long term (current) drug therapy: Secondary | ICD-10-CM | POA: Diagnosis not present

## 2021-05-30 DIAGNOSIS — Z0001 Encounter for general adult medical examination with abnormal findings: Secondary | ICD-10-CM | POA: Diagnosis not present

## 2021-05-30 DIAGNOSIS — E7800 Pure hypercholesterolemia, unspecified: Secondary | ICD-10-CM | POA: Diagnosis not present

## 2021-06-02 NOTE — Addendum Note (Signed)
Addended by: Patria Mane A on: 06/02/2021 02:11 PM   Modules accepted: Orders
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
## 2021-06-05 ENCOUNTER — Other Ambulatory Visit: Payer: Self-pay

## 2021-06-05 ENCOUNTER — Other Ambulatory Visit (INDEPENDENT_AMBULATORY_CARE_PROVIDER_SITE_OTHER): Payer: Medicare Other

## 2021-06-05 ENCOUNTER — Encounter: Payer: Self-pay | Admitting: Physician Assistant

## 2021-06-05 ENCOUNTER — Ambulatory Visit (INDEPENDENT_AMBULATORY_CARE_PROVIDER_SITE_OTHER): Payer: Medicare Other | Admitting: Physician Assistant

## 2021-06-05 DIAGNOSIS — I2510 Atherosclerotic heart disease of native coronary artery without angina pectoris: Secondary | ICD-10-CM | POA: Diagnosis not present

## 2021-06-05 DIAGNOSIS — R413 Other amnesia: Secondary | ICD-10-CM | POA: Diagnosis not present

## 2021-06-05 LAB — TSH: TSH: 1.63 u[IU]/mL (ref 0.35–5.50)

## 2021-06-05 LAB — VITAMIN B12: Vitamin B-12: 159 pg/mL — ABNORMAL LOW (ref 211–911)

## 2021-06-05 NOTE — Patient Instructions (Signed)
We have sent a referral to [facility] for your MRI and they will call you directly to schedule your appointment. They are located at [address] If you need to contact them directly please call [phone].  Your provider has requested that you have labwork completed today. Please go to [facility] ([address]) on the second floor of this building before leaving the office today. You do not need to check in. If you are not called within 15 minutes please check with the front desk.  

## 2021-06-08 NOTE — Progress Notes (Signed)
No answer at 118 06/08/2021

## 2021-06-09 ENCOUNTER — Other Ambulatory Visit: Payer: Self-pay

## 2021-06-09 ENCOUNTER — Ambulatory Visit (INDEPENDENT_AMBULATORY_CARE_PROVIDER_SITE_OTHER)
Admission: RE | Admit: 2021-06-09 | Discharge: 2021-06-09 | Disposition: A | Discharge status: Home / Self Care | Payer: Self-pay | Source: Ambulatory Visit | Attending: Cardiology | Admitting: Cardiology

## 2021-06-09 DIAGNOSIS — I2510 Atherosclerotic heart disease of native coronary artery without angina pectoris: Secondary | ICD-10-CM

## 2021-06-09 DIAGNOSIS — E785 Hyperlipidemia, unspecified: Secondary | ICD-10-CM

## 2021-06-09 NOTE — Progress Notes (Signed)
Left message at 8:36 to call office back regarding labs.

## 2021-06-14 ENCOUNTER — Other Ambulatory Visit: Payer: Self-pay | Admitting: *Deleted

## 2021-06-14 ENCOUNTER — Telehealth: Payer: Self-pay | Admitting: *Deleted

## 2021-06-14 DIAGNOSIS — R931 Abnormal findings on diagnostic imaging of heart and coronary circulation: Secondary | ICD-10-CM

## 2021-06-14 DIAGNOSIS — I2510 Atherosclerotic heart disease of native coronary artery without angina pectoris: Secondary | ICD-10-CM

## 2021-06-14 DIAGNOSIS — I77819 Aortic ectasia, unspecified site: Secondary | ICD-10-CM

## 2021-06-14 NOTE — Telephone Encounter (Signed)
Donato Heinz, MD  06/11/2021 10:24 PM EDT     Calcium score 971 (89th percentile for age/gender).  Recommend Lexiscan Myoview to evaluate for ischemia given significantly elevated calcium score.   Dilated aorta, recommend CTA chest in 1 year to follow-up    Discussed with patient, patient aware and verbalized understanding.  Orders placed.

## 2021-06-16 ENCOUNTER — Telehealth (HOSPITAL_COMMUNITY): Payer: Self-pay | Admitting: *Deleted

## 2021-06-16 NOTE — Telephone Encounter (Signed)
Left message on voicemail per DPR in reference to upcoming appointment scheduled on 06/21/21 at 8:00 with detailed instructions given per Myocardial Perfusion Study Information Sheet for the test. LM to arrive 15 minutes early, and that it is imperative to arrive on time for appointment to keep from having the test rescheduled. If you need to cancel or reschedule your appointment, please call the office within 24 hours of your appointment. Failure to do so may result in a cancellation of your appointment, and a $50 no show fee. Phone number given for call back for any questions.

## 2021-06-20 DIAGNOSIS — Z23 Encounter for immunization: Secondary | ICD-10-CM | POA: Diagnosis not present

## 2021-06-21 ENCOUNTER — Other Ambulatory Visit: Payer: Self-pay

## 2021-06-21 ENCOUNTER — Ambulatory Visit (HOSPITAL_COMMUNITY): Payer: Medicare Other | Attending: Cardiology

## 2021-06-21 DIAGNOSIS — I2510 Atherosclerotic heart disease of native coronary artery without angina pectoris: Secondary | ICD-10-CM | POA: Diagnosis not present

## 2021-06-21 DIAGNOSIS — R931 Abnormal findings on diagnostic imaging of heart and coronary circulation: Secondary | ICD-10-CM | POA: Diagnosis not present

## 2021-06-21 LAB — MYOCARDIAL PERFUSION IMAGING
Base ST Depression (mm): 0 mm
LV dias vol: 126 mL (ref 62–150)
LV sys vol: 47 mL
Nuc Stress EF: 63 %
Peak HR: 68 {beats}/min
Rest HR: 47 {beats}/min
Rest Nuclear Isotope Dose: 10.6 mCi
SDS: 2
SRS: 0
SSS: 2
ST Depression (mm): 0 mm
Stress Nuclear Isotope Dose: 30.4 mCi
TID: 1.03

## 2021-06-21 MED ORDER — TECHNETIUM TC 99M TETROFOSMIN IV KIT
10.6000 | PACK | Freq: Once | INTRAVENOUS | Status: AC | PRN
Start: 2021-06-21 — End: 2021-06-21
  Administered 2021-06-21: 10.6 via INTRAVENOUS
  Filled 2021-06-21: qty 11

## 2021-06-21 MED ORDER — TECHNETIUM TC 99M TETROFOSMIN IV KIT
30.4000 | PACK | Freq: Once | INTRAVENOUS | Status: AC | PRN
Start: 2021-06-21 — End: 2021-06-21
  Administered 2021-06-21: 30.4 via INTRAVENOUS
  Filled 2021-06-21: qty 31

## 2021-06-21 MED ORDER — REGADENOSON 0.4 MG/5ML IV SOLN
0.4000 mg | Freq: Once | INTRAVENOUS | Status: AC
  Administered 2021-06-21: 0.4 mg via INTRAVENOUS

## 2021-06-24 ENCOUNTER — Ambulatory Visit
Admission: RE | Admit: 2021-06-24 | Discharge: 2021-06-24 | Disposition: A | Discharge status: Home / Self Care | Payer: Medicare Other | Source: Ambulatory Visit | Attending: Physician Assistant | Admitting: Physician Assistant

## 2021-06-24 ENCOUNTER — Other Ambulatory Visit: Payer: Self-pay

## 2021-06-24 DIAGNOSIS — R413 Other amnesia: Secondary | ICD-10-CM | POA: Diagnosis not present

## 2021-06-28 ENCOUNTER — Ambulatory Visit (INDEPENDENT_AMBULATORY_CARE_PROVIDER_SITE_OTHER): Payer: Medicare Other | Admitting: Podiatry

## 2021-06-28 ENCOUNTER — Encounter: Payer: Self-pay | Admitting: Podiatry

## 2021-06-28 ENCOUNTER — Other Ambulatory Visit: Payer: Self-pay

## 2021-06-28 DIAGNOSIS — I2510 Atherosclerotic heart disease of native coronary artery without angina pectoris: Secondary | ICD-10-CM

## 2021-06-28 DIAGNOSIS — L600 Ingrowing nail: Secondary | ICD-10-CM

## 2021-06-28 NOTE — Progress Notes (Signed)
Subjective:   Patient ID: Raymond Robinson, male   DOB: 65 y.o.   MRN: [id number]   HPI Patient states is developed damaged second nail right foot and its been difficult for him to wear shoe gear and has had previous hallux and second nail removal left in the past and states it gets sore and he cannot cut it anymore.  Patient has exercise routine and does smoke half pack per day   Review of Systems  All other systems reviewed and are negative.      Objective:  Physical Exam Vitals and nursing note reviewed.  Constitutional:      Appearance: He is well-developed.  Pulmonary:     Effort: Pulmonary effort is normal.  Musculoskeletal:        General: Normal range of motion.  Skin:    General: Skin is warm.  Neurological:     Mental Status: He is alert.    Neurovascular status intact muscle strength adequate range of motion within normal limits with patient found to have a damaged second nail right foot that is dystrophic painful when pressed making shoe gear difficult.  Patient has good digital perfusion well oriented x3     Assessment:  Damaged second nail right that is elongated thickened and painful     Plan:  H&P reviewed condition recommended nail removal explained procedure risk and patient wants surgery.  At this point I explained what would be done he is comfortable with this and signed consent form and I infiltrated the right second toe 60 mg like Marcaine mixture sterile prep done and using sterile instrumentation remove the second nail exposed matrix applied phenol for applications 30 seconds followed by alcohol by sterile dressing gave instructions on soaks and reappoint to recheck.  Patient is encouraged to call with questions concerns and leave dressing on 24 hours but take it off earlier if any throbbing were to occur

## 2021-06-28 NOTE — Patient Instructions (Signed)

## 2021-07-05 DIAGNOSIS — R972 Elevated prostate specific antigen [PSA]: Secondary | ICD-10-CM | POA: Diagnosis not present

## 2021-07-05 DIAGNOSIS — N419 Inflammatory disease of prostate, unspecified: Secondary | ICD-10-CM | POA: Diagnosis not present

## 2021-08-08 DIAGNOSIS — D225 Melanocytic nevi of trunk: Secondary | ICD-10-CM | POA: Diagnosis not present

## 2021-08-08 DIAGNOSIS — L853 Xerosis cutis: Secondary | ICD-10-CM | POA: Diagnosis not present

## 2021-08-08 DIAGNOSIS — I788 Other diseases of capillaries: Secondary | ICD-10-CM | POA: Diagnosis not present

## 2021-08-08 DIAGNOSIS — L821 Other seborrheic keratosis: Secondary | ICD-10-CM | POA: Diagnosis not present

## 2021-08-08 DIAGNOSIS — L720 Epidermal cyst: Secondary | ICD-10-CM | POA: Diagnosis not present

## 2021-11-05 NOTE — Progress Notes (Signed)
?Cardiology Office Note:   ? ?Date:  11/08/2021  ? ?ID:  Raymond Robinson, DOB 13-Mar-1956, MRN [id number] ? ?PCP:  Lujean Amel, MD  ?Cardiologist:  None  ?Electrophysiologist:  None  ? ?Referring MD: Lujean Amel, MD  ? ?Chief Complaint  ?Patient presents with  ? Coronary Artery Disease  ? ? ?History of Present Illness:   ? ?Raymond Robinson is a 66 y.o. male with a hx of abdominal aortic aneurysm, hyperlipidemia, OSA, asthma, tobacco use who presents for follow-up.  He was referred by Dr. Dorthy Cooler for evaluation of coronary calcification seen on CT, initially seen on 04/27/2021. CT chest for lung cancer screening on 02/07/2021 showed significant coronary artery calcifications. ? ?He follows with Dr. Stanford Breed in vascular surgery for abdominal aortic aneurysm (measuring 36 mm), left common iliac artery aneurysm (27 mm), right common iliac artery aneurysm (20 mm).  Also of noted to have right renal artery stenosis without issues with hypertension or renal dysfunction.  Last saw Dr. Stanford Breed May 2022, planned follow-up imaging in 1 year. ? ?Calcium score 971 on 06/09/2021 (89th percentile).  Ascending aortic dilatation measuring 42 mm.  Lexiscan Myoview on 06/21/2021 normal perfusion, EF 63%. ? ?Since last clinic visit, he reports that he has been doing well.  Denies any chest pain, dyspnea, lightheadedness, syncope, lower extremity edema, or palpitations.  He exercises with pilates, yard work, and walks about once per week for 15-30 minutes.  No exertional chest pain or dyspnea. ? ? ?Past Medical History:  ?Diagnosis Date  ? AAA (abdominal aortic aneurysm)   ? Acid reflux   ? Asthma   ? Colon polyps   ? Hiatal hernia   ? Hyperlipidemia   ? Pilonidal cyst   ? Sleep apnea   ? ? ?Past Surgical History:  ?Procedure Laterality Date  ? JOINT REPLACEMENT Right 2005  ? Knee  ? KNEE SURGERY    ? right  ? ? ?Current Medications: ?Current Meds  ?Medication Sig  ? acetaminophen (TYLENOL) 500 MG tablet Take 1,000 mg by mouth every 6 (six)  hours as needed for mild pain, moderate pain, fever or headache.  ? alendronate (FOSAMAX) 70 MG tablet   ? aspirin EC 81 MG tablet Take 1 tablet (81 mg total) by mouth daily. Swallow whole.  ? atorvastatin (LIPITOR) 20 MG tablet Take 20 mg by mouth at bedtime.  ? clotrimazole-betamethasone (LOTRISONE) cream   ? fluticasone (FLONASE) 50 MCG/ACT nasal spray Place into the nose.  ? valACYclovir (VALTREX) 1000 MG tablet Take by mouth.  ?  ? ?Allergies:   Penicillins  ? ?Social History  ? ?Socioeconomic History  ? Marital status: Married  ?  Spouse name: Not on file  ? Number of children: 3  ? Years of education: 57  ? Highest education level: Not on file  ?Occupational History  ? Not on file  ?Tobacco Use  ? Smoking status: Every Day  ?  Packs/day: 0.50  ?  Years: 43.00  ?  Pack years: 21.50  ?  Types: Cigarettes  ? Smokeless tobacco: Never  ?Vaping Use  ? Vaping Use: Never used  ?Substance and Sexual Activity  ? Alcohol use: Yes  ? Drug use: No  ? Sexual activity: Not on file  ?Other Topics Concern  ? Not on file  ?Social History Narrative  ? Right handed  ? One story home  ? Drinks caffeine  ? ?Social Determinants of Health  ? ?Financial Resource Strain: Not on file  ?Food Insecurity:  Not on file  ?Transportation Needs: Not on file  ?Physical Activity: Not on file  ?Stress: Not on file  ?Social Connections: Not on file  ?  ? ?Family History: ?Reports both his parents had heart issues but he is unsure of the details. ? ?ROS:   ?Please see the history of present illness.    ? All other systems reviewed and are negative. ? ?EKGs/Labs/Other Studies Reviewed:   ? ?The following studies were reviewed today: ? ? ?EKG:   ?11/08/21: NSR, PAC, rate 55 ? ?Recent Labs: ?06/05/2021: TSH 1.63  ?Recent Lipid Panel ?No results found for: CHOL, TRIG, HDL, CHOLHDL, VLDL, LDLCALC, LDLDIRECT ? ?Physical Exam:   ? ?VS:  BP 126/78   Pulse (!) 55   Ht '6\' 3"'$  (1.905 m)   Wt 176 lb 9.6 oz (80.1 kg)   SpO2 100%   BMI 22.07 kg/m?    ? ?Wt  Readings from Last 3 Encounters:  ?11/08/21 176 lb 9.6 oz (80.1 kg)  ?06/21/21 173 lb (78.5 kg)  ?04/27/21 173 lb (78.5 kg)  ?  ? ?GEN:  Well nourished, well developed in no acute distress ?HEENT: Normal ?NECK: No JVD; No carotid bruits ?LYMPHATICS: No lymphadenopathy ?CARDIAC: RRR, no murmurs, rubs, gallops ?RESPIRATORY:  Clear to auscultation without rales, wheezing or rhonchi  ?ABDOMEN: Soft, non-tender, non-distended ?MUSCULOSKELETAL:  No edema; No deformity  ?SKIN: Warm and dry ?NEUROLOGIC:  Alert and oriented x 3 ?PSYCHIATRIC:  Normal affect  ? ?ASSESSMENT:   ? ?1. CAD in native artery   ?2. Abdominal aortic aneurysm (AAA) without rupture, unspecified part   ?3. Aortic dilatation (HCC)   ?4. Tobacco use   ? ? ?PLAN:   ? ?CAD:  Calcium score 971 on 06/09/2021 (89th percentile).  Lexiscan Myoview on 06/21/2021 normal perfusion, EF 63%. ?-Continue atorvastatin 20 mg daily.  LDL 54 on 05/30/2021, at goal less than 70 ?-Continue aspirin 81 mg daily ? ?AAA: He follows with Dr. Stanford Breed in vascular surgery for abdominal aortic aneurysm (measuring 36 mm), left common iliac artery aneurysm (27 mm), right common iliac artery aneurysm (20 mm).  Also of noted to have right renal artery stenosis without issues with hypertension or renal dysfunction.  Last saw Dr. Stanford Breed May 2022, planned follow-up imaging in 1 year. ? ?Tobacco use: Patient counseled on risks of smoking and cessation strongly encouraged.  Will ask our care guide to work with him to help with cessation ? ?Hyperlipidemia: LDL 54 on 05/30/2021.  Continue atorvastatin 20 mg daily ? ?OSA: Reports prior diagnosis but unable to tolerate CPAP.  Subsequently lost 80 to 90 pounds, so may no longer be an issue ? ?Aortic dilatation: Ascending aortic dilatation measuring 42 mm on calcium score 06/21/2021.  Plan CTA chest in 1 year ? ?RTC in 1 year ? ? ? ?Medication Adjustments/Labs and Tests Ordered: ?Current medicines are reviewed at length with the patient today.  Concerns  regarding medicines are outlined above.  ?No orders of the defined types were placed in this encounter. ? ?No orders of the defined types were placed in this encounter. ? ? ?There are no Patient Instructions on file for this visit.  ? ?Signed, ?Donato Heinz, MD  ?11/08/2021 8:54 AM    ?[place] ? ?

## 2021-11-08 ENCOUNTER — Other Ambulatory Visit: Payer: Self-pay

## 2021-11-08 ENCOUNTER — Ambulatory Visit (INDEPENDENT_AMBULATORY_CARE_PROVIDER_SITE_OTHER): Payer: Medicare Other | Admitting: Cardiology

## 2021-11-08 VITALS — BP 126/78 | HR 55 | Ht 75.0 in | Wt 176.6 lb

## 2021-11-08 DIAGNOSIS — Z720 Tobacco use: Secondary | ICD-10-CM | POA: Diagnosis not present

## 2021-11-08 DIAGNOSIS — I77819 Aortic ectasia, unspecified site: Secondary | ICD-10-CM | POA: Diagnosis not present

## 2021-11-08 DIAGNOSIS — I714 Abdominal aortic aneurysm, without rupture, unspecified: Secondary | ICD-10-CM | POA: Diagnosis not present

## 2021-11-08 DIAGNOSIS — I2510 Atherosclerotic heart disease of native coronary artery without angina pectoris: Secondary | ICD-10-CM

## 2021-11-08 NOTE — Patient Instructions (Signed)
Medication Instructions:  ?Your physician recommends that you continue on your current medications as directed. Please refer to the Current Medication list given to you today. ? ?*If you need a refill on your cardiac medications before your next appointment, please call your pharmacy* ? ?Follow-Up: ?At [facility], you and your health needs are our priority.  As part of our continuing mission to provide you with exceptional heart care, we have created designated Provider Care Teams.  These Care Teams include your primary Cardiologist (physician) and Advanced Practice Providers (APPs -  Physician Assistants and Nurse Practitioners) who all work together to provide you with the care you need, when you need it. ? ?We recommend signing up for the patient portal called "MyChart".  Sign up information is provided on this After Visit Summary.  MyChart is used to connect with patients for Virtual Visits (Telemedicine).  Patients are able to view lab/test results, encounter notes, upcoming appointments, etc.  Non-urgent messages can be sent to your provider as well.   ?To learn more about what you can do with MyChart, go to [email or web address].   ? ?Your next appointment:   ?12 month(s) ? ?The format for your next appointment:   ?In Person ? ?Provider:   ?Dr. Gardiner Rhyme ? ? ?Other Instructions ?You have been referred to: our Careguide, Amy for assistance with smoking cessation ? ? ?

## 2021-11-09 ENCOUNTER — Ambulatory Visit: Payer: Medicare Other

## 2021-11-09 ENCOUNTER — Telehealth: Payer: Self-pay

## 2021-11-09 ENCOUNTER — Encounter: Payer: Self-pay | Admitting: Psychology

## 2021-11-09 ENCOUNTER — Ambulatory Visit (INDEPENDENT_AMBULATORY_CARE_PROVIDER_SITE_OTHER): Payer: Medicare Other | Admitting: Psychology

## 2021-11-09 DIAGNOSIS — I6781 Acute cerebrovascular insufficiency: Secondary | ICD-10-CM | POA: Diagnosis not present

## 2021-11-09 DIAGNOSIS — I701 Atherosclerosis of renal artery: Secondary | ICD-10-CM | POA: Insufficient documentation

## 2021-11-09 DIAGNOSIS — I2510 Atherosclerotic heart disease of native coronary artery without angina pectoris: Secondary | ICD-10-CM | POA: Insufficient documentation

## 2021-11-09 DIAGNOSIS — Z0000 Encounter for general adult medical examination without abnormal findings: Secondary | ICD-10-CM

## 2021-11-09 DIAGNOSIS — E7800 Pure hypercholesterolemia, unspecified: Secondary | ICD-10-CM | POA: Insufficient documentation

## 2021-11-09 DIAGNOSIS — G3184 Mild cognitive impairment, so stated: Secondary | ICD-10-CM | POA: Diagnosis not present

## 2021-11-09 DIAGNOSIS — R4189 Other symptoms and signs involving cognitive functions and awareness: Secondary | ICD-10-CM

## 2021-11-09 DIAGNOSIS — F1010 Alcohol abuse, uncomplicated: Secondary | ICD-10-CM

## 2021-11-09 DIAGNOSIS — F1011 Alcohol abuse, in remission: Secondary | ICD-10-CM | POA: Insufficient documentation

## 2021-11-09 DIAGNOSIS — G4733 Obstructive sleep apnea (adult) (pediatric): Secondary | ICD-10-CM | POA: Insufficient documentation

## 2021-11-09 DIAGNOSIS — Z720 Tobacco use: Secondary | ICD-10-CM | POA: Insufficient documentation

## 2021-11-09 HISTORY — DX: Mild cognitive impairment of uncertain or unknown etiology: G31.84

## 2021-11-09 NOTE — Progress Notes (Signed)
? ?  Psychometrician Note ?  ?Cognitive testing was administered to Raymond Robinson by Cruzita Lederer, B.S. (psychometrist) under the supervision of Dr. Christia Reading, Ph.D., licensed psychologist on 11/09/2021. Raymond Robinson did not appear overtly distressed by the testing session per behavioral observation or responses across self-report questionnaires. Rest breaks were offered.  ?  ?The battery of tests administered was selected by Dr. Christia Reading, Ph.D. with consideration to Raymond Robinson current level of functioning, the nature of his symptoms, emotional and behavioral responses during interview, level of literacy, observed level of motivation/effort, and the nature of the referral question. This battery was communicated to the psychometrist. Communication between Dr. Christia Reading, Ph.D. and the psychometrist was ongoing throughout the evaluation and Dr. Christia Reading, Ph.D. was immediately accessible at all times. Dr. Christia Reading, Ph.D. provided supervision to the psychometrist on the date of this service to the extent necessary to assure the quality of all services provided.  ?  ?Raymond Robinson will return within approximately 1-2 weeks for an interactive feedback session with Dr. Melvyn Novas at which time his test performances, clinical impressions, and treatment recommendations will be reviewed in detail. Raymond Robinson understands he can contact our office should he require our assistance before this time. ? ?A total of 135 minutes of billable time were spent face-to-face with Raymond Robinson by the psychometrist. This includes both test administration and scoring time. Billing for these services is reflected in the clinical report generated by Dr. Christia Reading, Ph.D. ? ?This note reflects time spent with the psychometrician and does not include test scores or any clinical interpretations made by Dr. Melvyn Novas. The full report will follow in a separate note.  ?

## 2021-11-09 NOTE — Progress Notes (Signed)
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 verbal description of the nature and purpose of the present neuropsychological evaluation. Also reviewed were the foreseeable risks and/or discomforts and benefits of the procedure, limits of confidentiality, and mandatory reporting requirements of this provider. The patient was given the opportunity to ask questions and receive answers about the evaluation. Oral consent to participate was provided by the patient.   This evaluation was conducted by Christia Reading, Ph.D., ABPP-CN, board certified clinical neuropsychologist. Mr. Bjelland completed a clinical interview with Dr. Melvyn Novas, billed as one unit [phone], and 135 minutes of cognitive testing and scoring, billed as one unit [phone] and four additional units [id number]. Psychometrist Cruzita Lederer, B.S., assisted Dr. Melvyn Novas with test administration and scoring procedures. As a separate and discrete service, Dr. Melvyn Novas spent a total of 160 minutes in interpretation and report writing billed as one unit [phone] and two units 96133.

## 2021-11-09 NOTE — Telephone Encounter (Signed)
Called patient per referral from Dr. Gardiner Rhyme regarding smoking cessation. Patient did not answer. Left message for patient to return call to discuss health coaching to aid in quitting smoking. ? ? ?Avelino Leeds, [place] ?[facility] ?Care Guide, Health Coach ?[address] ?[place] [address] ?Telephone: [phone] ?Email: [email or web address] ? ?

## 2021-11-13 ENCOUNTER — Telehealth: Payer: Self-pay

## 2021-11-13 DIAGNOSIS — Z0000 Encounter for general adult medical examination without abnormal findings: Secondary | ICD-10-CM

## 2021-11-13 NOTE — Telephone Encounter (Signed)
Patient called in regarding health coaching per reference from Dr. Gardiner Rhyme. Explained health coaching process to patient. Patient stated that he is interested in health coaching for smoking cessation. Patient has been scheduled for an in-person health coaching session on 3/14 at 11:00am.  ? ?Avelino Leeds, [place] ?[facility] ?Care Guide, Health Coach ?[address] ?[place] [address] ?Telephone: [phone] ?Email: [email or web address] ? ?

## 2021-11-14 ENCOUNTER — Ambulatory Visit (INDEPENDENT_AMBULATORY_CARE_PROVIDER_SITE_OTHER): Payer: Medicare Other

## 2021-11-14 ENCOUNTER — Other Ambulatory Visit: Payer: Self-pay

## 2021-11-14 DIAGNOSIS — Z0000 Encounter for general adult medical examination without abnormal findings: Secondary | ICD-10-CM

## 2021-11-14 NOTE — Progress Notes (Signed)
Appointment Outcome: Completed, Session #: Initial session ?Start time: 11:06am   End time: 11:52am   Total Mins: 46 minutes ? ?AGREEMENTS SECTION ? ? ? ?Overall Goal(s): ?Smoking cessation - Quit date TBD                                          ? ?Agreement/Action Steps:  ?Maintain smoking 8-10 cigarettes per day  ?Continue to smoke half a cigarette at a time and wait at least 30 minutes before smoking the other half   ?Practice mini quits - Extend the wait time between smoking cigarettes as long as possible  ?Use lozenges to deter smoking when cravings arise   ?Read the Quit4Life booklet and start working through the activities   ? ?  ? ?Progress Notes:  ?Patient stated that he used to smoke about 1 pack to 1.5 packs of cigarettes per day. Patient shared that he has been able to reduce his smoking over time to a half pack of cigarettes per day. Patient mentioned that he started smoking 1/2 cigarette at a time then about 30 minutes later he smokes the other half. Patient expressed that he does smoke more when he is stressed, but that does not occur every day.  ? ?Patient shared that he does not smoke in the house or around his wife when they are in the car together. Patient mentioned that he may have a cigarette with his coffee and after eating. Patient mentioned that he has used lozenges before as needed to help with not smoking for extended periods of time. Patient expressed that he had made up his mind that he was going to work towards quitting smoking. Patient started writing down how many cigarettes he was smoking per day to increase his awareness of his smoking behavior.  ? ? ?Coaching Outcomes: ?Patient decided that he wanted to try using lozenges as an aid for smoking cessation, while working on cutting his smoking back. Patient will aim to smoke 8-10 cigarettes over the next two weeks. Patient will track his smoking behavior during this time.  ? ?Patient was provided the Quit4Life booklet to review.  Patient was given a copy of the Welcome Letter and Code of Ethics for his records.  ? ?Patient will continue to smoke cigarette at a time and wait at least 30 minutes before smoking the other half of the cigarette. Patient will work towards increasing that 30-minute wait time as long as possible as he practices mini quits.  ? ?Patient is going to purchase lozenges today to help with deterring him from smoking and aid in practicing mini quits.  ? ?Patient will decide on a quit date later. ? ?Patient was emailed an outline of the action steps that were agreed upon.  ?

## 2021-11-15 DIAGNOSIS — R972 Elevated prostate specific antigen [PSA]: Secondary | ICD-10-CM | POA: Diagnosis not present

## 2021-11-20 ENCOUNTER — Ambulatory Visit (INDEPENDENT_AMBULATORY_CARE_PROVIDER_SITE_OTHER): Payer: Medicare Other | Admitting: Psychology

## 2021-11-20 ENCOUNTER — Other Ambulatory Visit: Payer: Self-pay

## 2021-11-20 DIAGNOSIS — Z789 Other specified health status: Secondary | ICD-10-CM

## 2021-11-20 DIAGNOSIS — G3184 Mild cognitive impairment, so stated: Secondary | ICD-10-CM | POA: Diagnosis not present

## 2021-11-20 NOTE — Progress Notes (Signed)
? ?  Neuropsychology Feedback Session ?[facility] ?[place] Department of Neurology ? ?Reason for Referral:  ? ?Raymond Robinson is a 66 y.o. right-handed Caucasian male referred by  Sharene Butters, PA-C , to characterize his current cognitive functioning and assist with diagnostic clarity and treatment planning in the context of subjective cognitive decline.  ? ?Feedback:  ? ?Mr. Jelley completed a comprehensive neuropsychological evaluation on 11/09/2021. Please refer to that encounter for the full report and recommendations. Briefly, results suggested severe impairment across essentially all aspects of learning and memory. Additional weaknesses were exhibited across complex attention and executive functioning, while performance variability was exhibited across processing speed and visuospatial abilities. The etiology of ongoing dysfunction is unclear at the present time. Despite this, consideration should be given to the presence of Alzheimer's disease given ongoing memory impairment. Mr. Nghiem did not benefit from repeated exposure to information across learning trials, was fully amnestic (i.e., 0% retention) across all three verbal memory tasks, and performed poorly across yes/no recognition trials. Despite yielding a 100% retention rate on a visual memory task, this was likely due to happenstance guessing given this test's forced choice response format. Overall, memory testing suggests concerns for rapid forgetting and an evolving and already quite severe memory storage deficit, both of which are hallmark characteristics of Alzheimer's disease. There is also concern for a alcohol contribution to ongoing impairment.  ? ?Mr. Gheen was accompanied by his wife during the current feedback session. Content of the current session focused on the results of his neuropsychological evaluation. Mr. Delone was given the opportunity to ask questions and his questions were answered. He was encouraged to reach  out should additional questions arise. A copy of his report was provided at the conclusion of the visit.  ? ?  ? ?20 minutes were spent conducting the current feedback session with Mr. Chisolm, billed as one unit [phone].  ?

## 2021-11-21 DIAGNOSIS — N400 Enlarged prostate without lower urinary tract symptoms: Secondary | ICD-10-CM | POA: Diagnosis not present

## 2021-11-21 IMAGING — CT CT CARDIAC CORONARY ARTERY CALCIUM SCORE
3 series · 13 of 20 positions shown, 15 images · non-contrast
Comparison: None.
COMPARISON: None.

Addendum:
EXAM:
OVER-READ INTERPRETATION  CT CHEST

The following report is an over-read performed by radiologist Dr.
Lars-Vidar Matland [REDACTED] on 06/09/2021. This
over-read does not include interpretation of cardiac or coronary
anatomy or pathology. The coronary calcium score interpretation by
the cardiologist is attached.
CLINICAL DATA: Risk stratification: 65 Year-old White Male
Coronary Calcium Score
TECHNIQUE: The patient was scanned on a Siemens Force scanner. Axial
non-contrast 3 mm slices were carried out through the heart. The
data set was analyzed on a dedicated work station and scored using
the Agatson method.

[Series 2: cascseq 2.0 sa36 70% (id) · axial · 0.39mm/px · z∈[-285,-217]mm · 3 of 85 slices shown]
[im 17/85  vessel]
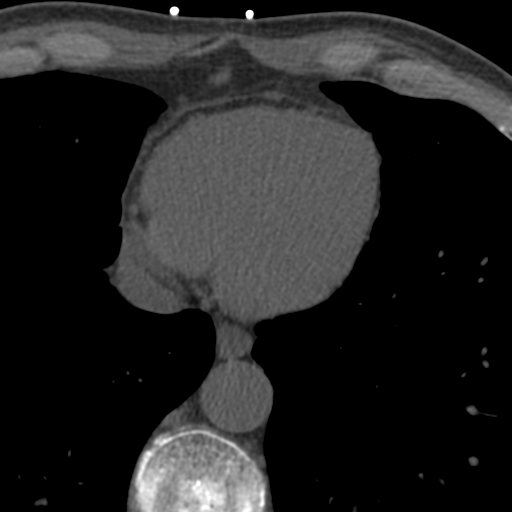
[im 34/85  vessel]
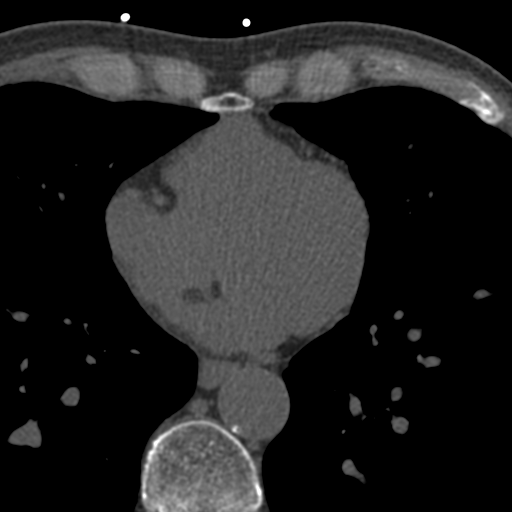
[im 51/85  vessel]
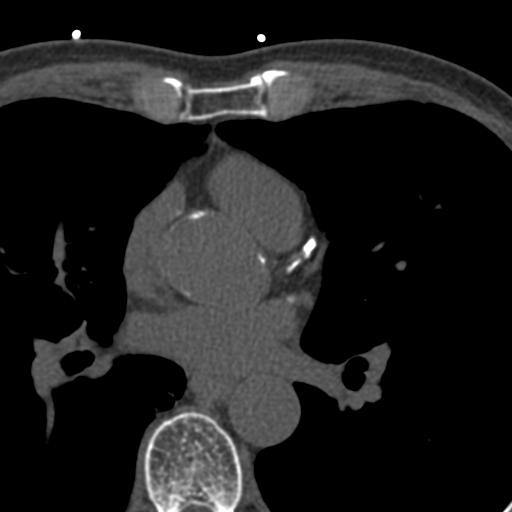

[Series 3: cascseq 2.0 bf37 st · axial · 0.75mm/px · z∈[-289,-177]mm · 5 of 85 slices shown, 7 images]
[im 15/85  vessel]
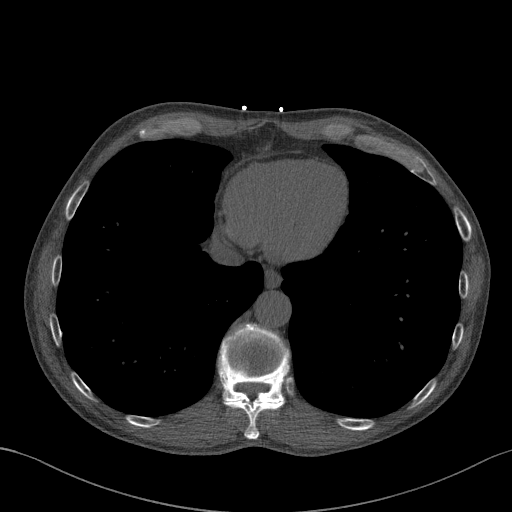
[im 15/85  lung]
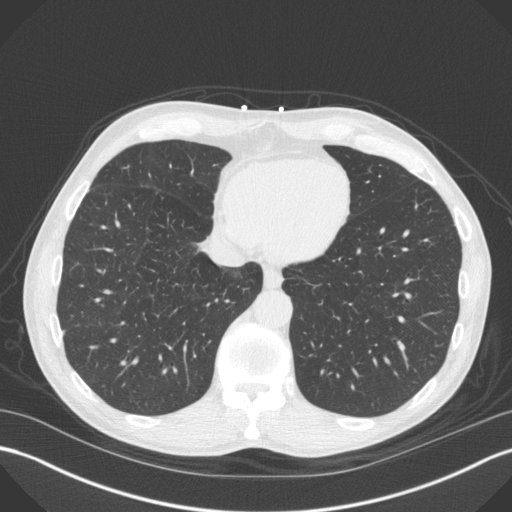
[im 29/85  vessel]
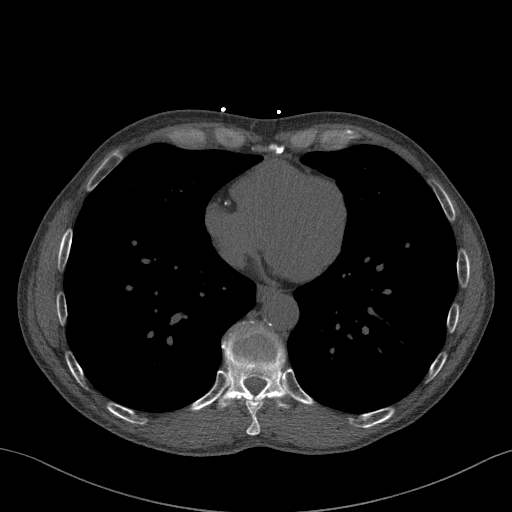
[im 43/85  vessel]
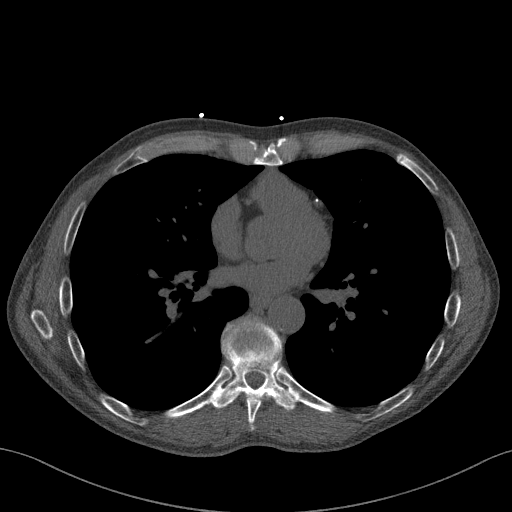
[im 57/85  vessel]
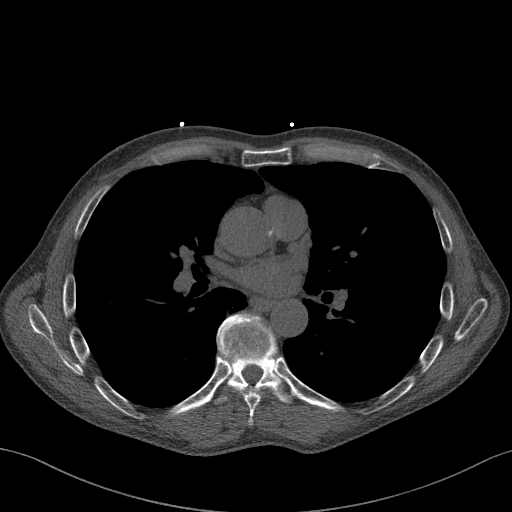
[im 71/85  vessel]
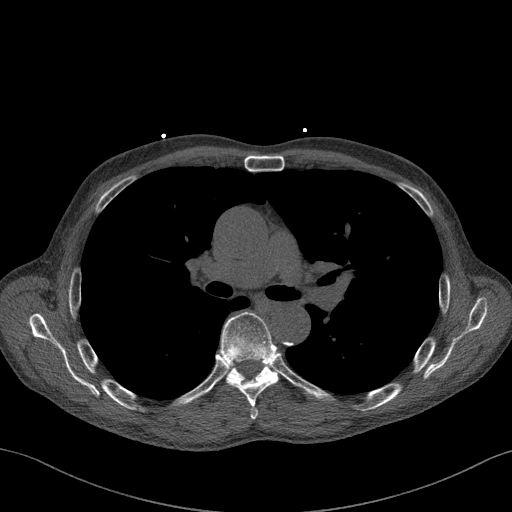
[im 71/85  lung]
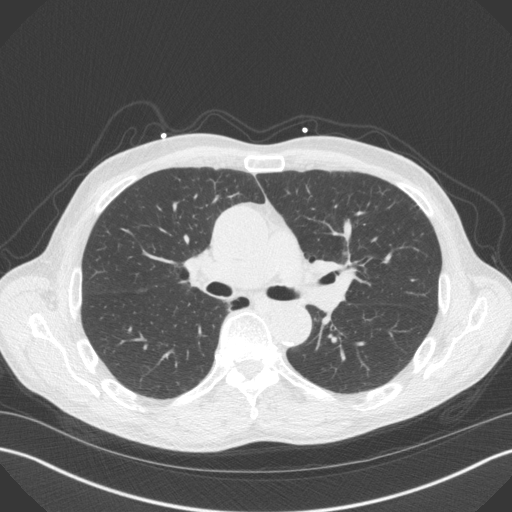

[Series 4: cascseq 2.0 br59 lung · axial · 0.75mm/px · z∈[-289,-177]mm · 5 of 85 slices shown]
[im 15/85  lung]
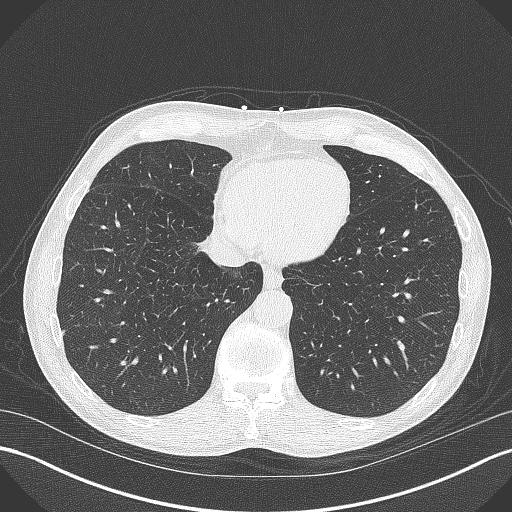
[im 29/85  lung]
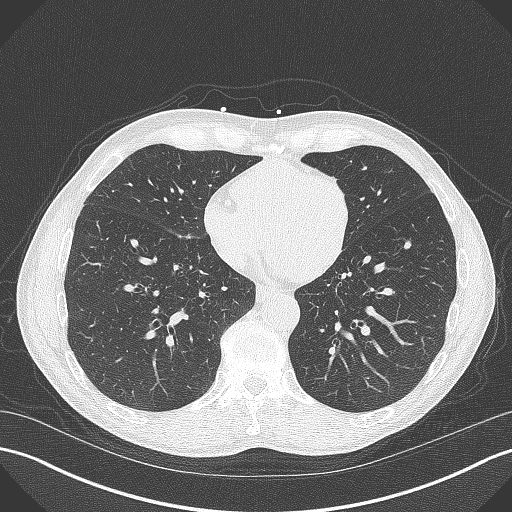
[im 43/85  lung]
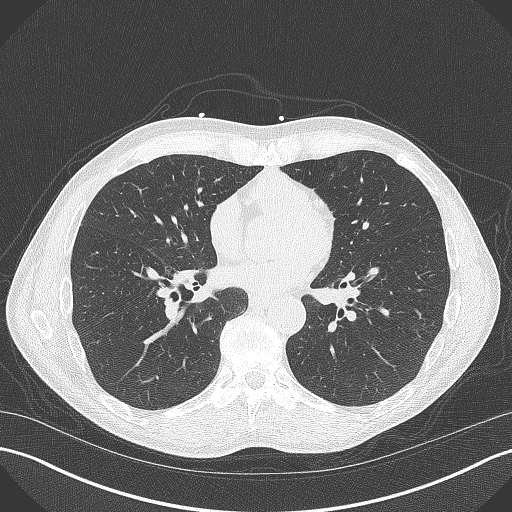
[im 57/85  lung]
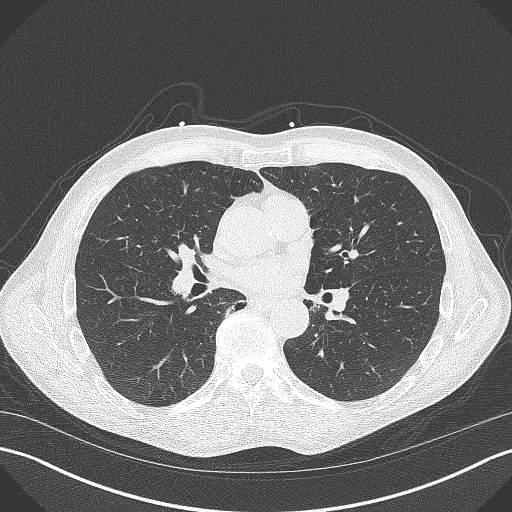
[im 71/85  lung]
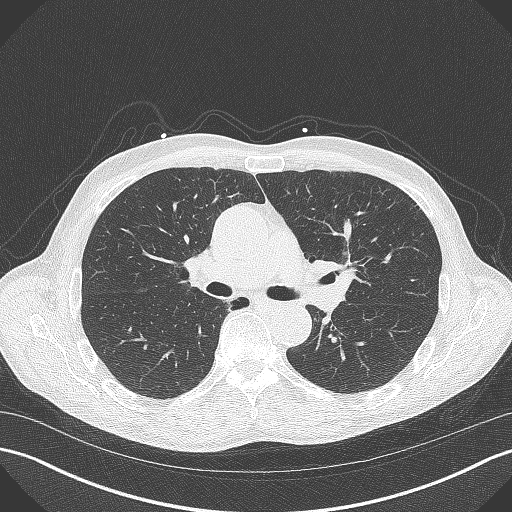

[13 of 20 positions shown; findings below may reference images not displayed]

FINDINGS: Aortic atherosclerosis. Within the visualized portions of the thorax
there are no suspicious appearing pulmonary nodules or masses, there
is no acute consolidative airspace disease, no pleural effusions, no
pneumothorax and no lymphadenopathy. Visualized portions of the
upper abdomen are unremarkable. Healing nondisplaced anterior
left-sided rib fracture. Chronic appearing compression fractures in
the mid and lower thoracic spine with up to 30% loss of anterior
vertebral body height. There are no aggressive appearing lytic or
blastic lesions noted in the visualized portions of the skeleton.
IMPRESSION: 1. Healing nondisplaced left-sided anterior rib fracture.
2.  Aortic Atherosclerosis (E05W2-2U9.9).
FINDINGS: Non-cardiac: See separate report from [REDACTED].

Ascending Aorta: Evidence of mild ascending aortic dilation 42 mm on
non-contrasted study. Aortic atherosclerosis noted.

Aortic Valve: Minimal aortic valve calcification: calcium score 64

Pericardium: Normal.

Coronary arteries: Normal origins.

Coronary Calcium Score:

Left main: 40

Left anterior descending artery: 802

Left circumflex artery: 21

Right coronary artery: 108

Total: 971

Percentile: 89th for age, sex, and race matched control.
IMPRESSION: 1. Coronary calcium score of 971. This was 89th percentile for age,
gender, and race matched controls.

2. Evidence of mild ascending aortic dilation 42 mm on
non-contrasted study. Consider secondary imaging modality
(echocardiogram, CTA Aorta Protocol, MRA Aorta Protocol) if
clinically indicated.

3. Aortic atherosclerosis noted.

4. Aortic Valve: Minimal aortic valve calcification: calcium score
64.

RECOMMENDATIONS:

The proposed cut-off value of 1,651 BOGBA yielded a 93 % sensitivity
and 75 % specificity in grading AS severity in patients with
classical low-flow, low-gradient AS. Proposed different cut-off
values to define severe AS for men and women as 2,065 BOGBA and 1,274
BOGBA, respectively. The joint European and American recommendations
for the assessment of AS consider the aortic valve calcium score as
a continuum - a very high calcium score suggests severe AS and a low
calcium score suggests severe AS is unlikely.

Norg, Thijme, et al. 9147 ESC/EACTS Guidelines for
the management of valvular heart disease. Eur Heart J
9147;[DATE].



If CAC = 0, it is reasonable to withhold statin therapy and reassess
in 5 to 10 years, as long as higher risk conditions are absent
(diabetes mellitus, family history of premature CHD in first degree
relatives (males <55 years; females <65 years), cigarette smoking,
LDL >=190 mg/dL or other independent risk factors).

If CAC is 1 to 99, it is reasonable to initiate statin therapy for
patients >=55 years of age.

If CAC is >=100 or >=75th percentile, it is reasonable to initiate
statin therapy at any age.

Cardiology referral should be considered for patients with CAC
scores =400 or >=75th percentile.

*9298 AHA/ACC/AACVPR/AAPA/ABC/CELINDA/ANTONIO MRKI/EVORA/Zohaib/HONOUR/ALKAFF/SHALLEY
Guideline on the Management of Blood Cholesterol: A Report of the
American College of Cardiology/American Heart Association Task Force
on Clinical Practice Guidelines. J Am Coll Cardiol.
8744;73(24):3863-3650.

*** End of Addendum ***
EXAM:
OVER-READ INTERPRETATION  CT CHEST

The following report is an over-read performed by radiologist Dr.
Lars-Vidar Matland [REDACTED] on 06/09/2021. This
over-read does not include interpretation of cardiac or coronary
anatomy or pathology. The coronary calcium score interpretation by
the cardiologist is attached.
FINDINGS: Aortic atherosclerosis. Within the visualized portions of the thorax
there are no suspicious appearing pulmonary nodules or masses, there
is no acute consolidative airspace disease, no pleural effusions, no
pneumothorax and no lymphadenopathy. Visualized portions of the
upper abdomen are unremarkable. Healing nondisplaced anterior
left-sided rib fracture. Chronic appearing compression fractures in
the mid and lower thoracic spine with up to 30% loss of anterior
vertebral body height. There are no aggressive appearing lytic or
blastic lesions noted in the visualized portions of the skeleton.
IMPRESSION: 1. Healing nondisplaced left-sided anterior rib fracture.
2.  Aortic Atherosclerosis (E05W2-2U9.9).

## 2021-11-23 ENCOUNTER — Encounter: Payer: Medicare Other | Admitting: Psychology

## 2021-11-28 ENCOUNTER — Other Ambulatory Visit: Payer: Self-pay

## 2021-11-28 ENCOUNTER — Ambulatory Visit (INDEPENDENT_AMBULATORY_CARE_PROVIDER_SITE_OTHER): Payer: Medicare Other

## 2021-11-28 DIAGNOSIS — Z0000 Encounter for general adult medical examination without abnormal findings: Secondary | ICD-10-CM

## 2021-11-28 NOTE — Progress Notes (Signed)
Appointment Outcome: Completed, Session #: 1 ?Start time: 11:17am   End time: 11:53am   Total Mins: 36 minutes ? ?AGREEMENTS SECTION ? ? ? ?Overall Goal(s): ?Smoking cessation - Quit date TBD                                          ?  ?Agreement/Action Steps:  ?Maintain smoking 8-10 cigarettes per day  ?Continue to smoke half a cigarette at a time and wait at least 30 minutes before smoking the other half   ?Practice mini quits - Extend the wait time between smoking cigarettes as long as possible  ?Use lozenges to deter smoking when cravings arise   ?Read  ? ?Progress Notes:  ?Patient expressed that he has not decided on a quit date yet. Patient shared that he has been spending a lot of time over the past two weeks being focused more on helping his daughter prep her new home rather than focus on himself and his efforts to reduce smoking.  ? ?Patient stated that he has not smoked more than half a pack of cigarettes (10) a day over the past two weeks. Patient mentioned that on some occasions that sometimes he finds himself smoking a whole cigarette depending on how he feels or how strong the urge is to smoke. Patient mentioned that at times he gets anxious when he has a lot of things to handle and starts to wonder if he can get everything done, which may contribute to his smoking.  ? ?Patient reported that he is smoking half a cigarette approximately 30% of the time. Patient stated that sometimes he catches himself smoking a cigarette and will put it out halfway. Patient has also practiced mini quits when he picks up the other half of the cigarette and talks himself out of smoking at that moment. Patient has not kept track of how long he has gone between smoking each half of cigarette.  ? ?Patient has recognized that he smokes less when he was distracted or was inside, especially when he was busy at his daughter's house since he does not smoke indoors. Patient mentioned that he has the lozenges but have not started  using them yet. Patient recognizes that quitting for him is going to be a process that will take time and patience with himself.  ? ?Indicators of Success and Accountability:  Patient has been able to maintain smoking no more than 10 cigarettes per day over the past two weeks.  ?Readiness: Patient is in the action phase of smoking cessation.  ?Strengths and Supports: Patient is being supported by family. Patient has been observing his smoking behavior and is noticing subtle changes.  ?Challenges and Barriers: Patient does not foresee any challenges to implementing his action steps as outlined above over the next two weeks.  ? ?Coaching Outcomes: ?Discussed with patient practicing deep breathing when he gets anxious as an alternative to smoking to calm himself. ? ?Patient was not able to start reading the Quit4Life booklet because it was not emailed to him after the last session. Patient was given a printed copy of the booklet during this session for his review.  ? ?Patient was provided a printed copy of his action steps to follow over the next two weeks. No changes were made to the action steps as listed above.  ? ?Patient stated that he will start using the lozenges today  to aid in reducing smoking.  ? ?Patient will track his smoking routine via worksheet in Quit4Life booklet.  ? ? ?Attempted: ?Fulfilled - Patient is smoking 10 cigarettes per day. Patient is practicing mini quits.  ?Partial - Patient smokes half a cigarette 30% of the time.  ?Not met - Patient has not started using the lozenges yet. Patient has not started reading the Quit4Life booklet. ?

## 2021-12-04 ENCOUNTER — Encounter: Payer: Self-pay | Admitting: Physician Assistant

## 2021-12-04 ENCOUNTER — Ambulatory Visit (INDEPENDENT_AMBULATORY_CARE_PROVIDER_SITE_OTHER): Payer: Medicare Other | Admitting: Physician Assistant

## 2021-12-04 VITALS — BP 115/67 | HR 75 | Resp 18 | Ht 75.0 in | Wt 172.0 lb

## 2021-12-04 DIAGNOSIS — G3184 Mild cognitive impairment, so stated: Secondary | ICD-10-CM | POA: Diagnosis not present

## 2021-12-04 DIAGNOSIS — I2510 Atherosclerotic heart disease of native coronary artery without angina pectoris: Secondary | ICD-10-CM | POA: Diagnosis not present

## 2021-12-04 MED ORDER — DONEPEZIL HCL 10 MG PO TABS: ORAL_TABLET | ORAL | 11 refills | Status: DC

## 2021-12-04 NOTE — Patient Instructions (Signed)
It was a pleasure to see you today at our office.  ? ?Recommendations: ? ?We will start donepezil half tablet ('5mg'$ ) daily for 2  weeks.  If you are tolerating the medication, then after 2 weeks, we will increase the dose to a full tablet of 10 mg daily.  Side effects include nausea, vomiting, diarrhea, vivid dreams, and muscle cramps.  Please call the clinic if you experience any of these symptoms.  ?Follow up in 6 months  ?No drinking or smoking ?Increase activity  ? ?RECOMMENDATIONS FOR ALL PATIENTS WITH MEMORY PROBLEMS: ?1. Continue to exercise (Recommend 30 minutes of walking everyday, or 3 hours every week) ?2. Increase social interactions - continue going to [place] and enjoy social gatherings with friends and family ?3. Eat healthy, avoid fried foods and eat more fruits and vegetables ?4. Maintain adequate blood pressure, blood sugar, and blood cholesterol level. Reducing the risk of stroke and cardiovascular disease also helps promoting better memory. ?5. Avoid stressful situations. Live a simple life and avoid aggravations. Organize your time and prepare for the next day in anticipation. ?6. Sleep well, avoid any interruptions of sleep and avoid any distractions in the bedroom that may interfere with adequate sleep quality ?7. Avoid sugar, avoid sweets as there is a strong link between excessive sugar intake, diabetes, and cognitive impairment ?We discussed the Mediterranean diet, which has been shown to help patients reduce the risk of progressive memory disorders and reduces cardiovascular risk. This includes eating fish, eat fruits and green leafy vegetables, nuts like almonds and hazelnuts, walnuts, and also use olive oil. Avoid fast foods and fried foods as much as possible. Avoid sweets and sugar as sugar use has been linked to worsening of memory function. ? ?There is always a concern of gradual progression of memory problems. If this is the case, then we may need to adjust level of care according to  patient needs. Support, both to the patient and caregiver, should then be put into place.  ? ? ?FALL PRECAUTIONS: Be cautious when walking. Scan the area for obstacles that may increase the risk of trips and falls. When getting up in the mornings, sit up at the edge of the bed for a few minutes before getting out of bed. Consider elevating the bed at the head end to avoid drop of blood pressure when getting up. Walk always in a well-lit room (use night lights in the walls). Avoid area rugs or power cords from appliances in the middle of the walkways. Use a walker or a cane if necessary and consider physical therapy for balance exercise. Get your eyesight checked regularly. ? ?FINANCIAL OVERSIGHT: Supervision, especially oversight when making financial decisions or transactions is also recommended. ? ?HOME SAFETY: Consider the safety of the kitchen when operating appliances like stoves, microwave oven, and blender. Consider having supervision and share cooking responsibilities until no longer able to participate in those. Accidents with firearms and other hazards in the house should be identified and addressed as well. ? ? ?ABILITY TO BE LEFT ALONE: If patient is unable to contact 911 operator, consider using [facility]Line, or when the need is there, arrange for someone to stay with patients. Smoking is a fire hazard, consider supervision or cessation. Risk of wandering should be assessed by caregiver and if detected at any point, supervision and safe proof recommendations should be instituted. ? ?MEDICATION SUPERVISION: Inability to self-administer medication needs to be constantly addressed. Implement a mechanism to ensure safe administration of the medications. ? ? ?  DRIVING: Regarding driving, in patients with progressive memory problems, driving will be impaired. We advise to have someone else do the driving if trouble finding directions or if minor accidents are reported. Independent driving assessment is available  to determine safety of driving. ? ? ?If you are interested in the driving assessment, you can contact the following: ? ?[facility] [employer] in [place] ? ?[place] [phone] ? ?[facility] [phone] ? ?[facility] [phone] or [phone] ? ? ? ?Mediterranean Diet ?A Mediterranean diet refers to food and lifestyle choices that are based on the traditions of countries located on the [employer]. This way of eating has been shown to help prevent certain conditions and improve outcomes for people who have chronic diseases, like kidney disease and heart disease. ?What are tips for following this plan? ?Lifestyle  ?Cook and eat meals together with your family, when possible. ?Drink enough fluid to keep your urine clear or pale yellow. ?Be physically active every day. This includes: ?Aerobic exercise like running or swimming. ?Leisure activities like gardening, walking, or housework. ?Get 7-8 hours of sleep each night. ?If recommended by your health care provider, drink red wine in moderation. This means 1 glass a day for nonpregnant women and 2 glasses a day for men. A glass of wine equals 5 oz (150 mL). ?Reading food labels  ?Check the serving size of packaged foods. For foods such as rice and pasta, the serving size refers to the amount of cooked product, not dry. ?Check the total fat in packaged foods. Avoid foods that have saturated fat or trans fats. ?Check the ingredients list for added sugars, such as corn syrup. ?Shopping  ?At the grocery store, buy most of your food from the areas near the walls of the store. This includes: ?Fresh fruits and vegetables (produce). ?Grains, beans, nuts, and seeds. Some of these may be available in unpackaged forms or large amounts (in bulk). ?Fresh seafood. ?Poultry and eggs. ?Low-fat dairy products. ?Buy whole ingredients instead of prepackaged foods. ?Buy fresh fruits and vegetables in-season from local  farmers markets. ?Buy frozen fruits and vegetables in resealable bags. ?If you do not have access to quality fresh seafood, buy precooked frozen shrimp or canned fish, such as tuna, salmon, or sardines. ?Buy small amounts of raw or cooked vegetables, salads, or olives from the deli or salad bar at your store. ?Stock your pantry so you always have certain foods on hand, such as olive oil, canned tuna, canned tomatoes, rice, pasta, and beans. ?Cooking  ?Cook foods with extra-virgin olive oil instead of using butter or other vegetable oils. ?Have meat as a side dish, and have vegetables or grains as your main dish. This means having meat in small portions or adding small amounts of meat to foods like pasta or stew. ?Use beans or vegetables instead of meat in common dishes like chili or lasagna. ?Experiment with different cooking methods. Try roasting or broiling vegetables instead of steaming or saut?eing them. ?Add frozen vegetables to soups, stews, pasta, or rice. ?Add nuts or seeds for added healthy fat at each meal. You can add these to yogurt, salads, or vegetable dishes. ?Marinate fish or vegetables using olive oil, lemon juice, garlic, and fresh herbs. ?Meal planning  ?Plan to eat 1 vegetarian meal one day each week. Try to work up to 2 vegetarian meals, if possible. ?Eat seafood 2 or more times a week. ?Have healthy snacks readily available, such as: ?Vegetable sticks with hummus. ?[place] yogurt. ?  Fruit and nut trail mix. ?Eat balanced meals throughout the week. This includes: ?Fruit: 2-3 servings a day ?Vegetables: 4-5 servings a day ?Low-fat dairy: 2 servings a day ?Fish, poultry, or lean meat: 1 serving a day ?Beans and legumes: 2 or more servings a week ?Nuts and seeds: 1-2 servings a day ?Whole grains: 6-8 servings a day ?Extra-virgin olive oil: 3-4 servings a day ?Limit red meat and sweets to only a few servings a month ?What are my food choices? ?Mediterranean diet ?Recommended ?Grains: Whole-grain pasta.  Brown rice. Bulgar wheat. Polenta. Couscous. Whole-wheat bread. [place]. ?Vegetables: Artichokes. Beets. Broccoli. Cabbage. Carrots. Eggplant. Green beans. Chard. Kale. Spinach. Onions. [place]. Pe

## 2021-12-04 NOTE — Progress Notes (Signed)
? ?Assessment/Plan:  ? ? 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 ?Discussed safety both in and out of the home.  ?Discussed the importance of regular daily schedule with inclusion of crossword puzzles to maintain brain function.  ?Continue to monitor mood by PCP ?Stay active at least 30 minutes at least 3 times a week.  ?Naps should be scheduled and should be no longer than 60 minutes and should not occur after 2 PM.  ?Mediterranean diet is recommended  ?Control cardiovascular risk factors  ?Start Donepezil 10 mg :Take half tablet (5 mg) daily for 2 weeks, then increase to the full tablet at 10 mg daily. Side effects discussed   ?Follow up in 6  months. ?Discontinue ETOH and tobacco abuse ?Repeat Neurocognitive testing in 12-18 months  ? ? ?Case discussed with Dr. Delice Lesch who agrees with the plan ? ? ? ? ?Subjective:  ? ? ?Raymond Robinson is a very pleasant 66 y.o. RH male with a history of sensorineural hearing loss, OSA, ETOH and tobacco habituation, CAD, AAA, a diagnosis of MCI by Neuropsych evaluation, seen today for evaluation of memory loss. This patient is accompanied in the office by his who supplements the history.  Previous records as well as any outside records available were reviewed prior to todays visit.  Patient was last seen at our office on 06/05/2021  at which time his MoCA was 20/30. Neuropsych evaluation on 11/09/21 yielded a diagnosis of Mild Neurocognitive Disorder ("mild cognitive impairment"), with unclear etiology,but suspicious for AD.  He states that his memory is about the same, however it may bring at times some episodes of anxiety due to the frustration regarding forgetfulness.  He denies a history of depression.  On his free time, he likes to play solitaire and other computer games.  He sleeps well, denies any vivid dreams or sleepwalking, hallucinations or paranoia.  He denies leaving objects in unusual places.  He denies needing assistance with bathing  and dressing.  He is in charge of his medications and denies missing any doses.  He uses a pillbox.  His wife, who is a Engineer, maintenance (IT), has always been doing the finances.  His appetite is good, denies trouble swallowing.  He does not cook very frequently, and when he does so, denies leaving the stove or the faucet on.  He ambulates without difficulty without the use of a walker or a cane, denies any fall or recent head injuries.  In 1988, [place] [address]  No other injuries since then.  He drives without GPS unless he goes to unfamiliar places or out of state.  He denies any headaches, double vision, dizziness, focal numbness or tingling, unilateral weakness or tremors, urine incontinence or retention, constipation or diarrhea.  He denies anosmia.  He is trying to quit tobacco with a cessation nurse.  He drinks 2 beers a night.  He is planning to quit.  He has a prior history of sleep apnea, had a sleep study at the time, did not like the CPAP, so he lost weight and his symptoms improved, he is to have a sleep study done as an outpatient in the near future.  He denies hallucinations or paranoia, or REM behavior. ? ?Initial Visit 06/05/21 The patient is seen in neurologic consultation at the request of Koirala, Dibas, MD for the evaluation of memory.  The patient is here alone. He is a  66 y.o. 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  He lives with his wife, who noticed these, and encouraged him to seek medical attention.  He states that his memory perhaps was worsened in June of this year until 3 weeks ago, when he was overseeing renovations to his house, which he reports was a stressful time.  He also states that COVID pandemic affected his mood and increased the anxiety, as he could not do the things he wanted to.  He denies  a history of depression.  On his free time, he likes to play solitaire and other computer games.  He sleeps well, denies any vivid dreams or sleepwalking, hallucinations or paranoia.  He denies leaving objects in unusual places, but he continues to me splays his keys in glasses, for which his wife 3 weeks ago boarding and monitors to "keep all the stuff in one place ".  He denies any assistance with bathing and dressing.  He is in charge of his medications and denies missing any doses.  He uses a pillbox.  His wife, who is a Engineer, maintenance (IT), has always been doing the finances.  His appetite is good, denies trouble swallowing.  He does not cook very frequently, and when he does so, denies leaving the stove or the faucet on.  He ambulates without difficulty without the use of a walker or a cane, denies any fall or recent head injuries.  In 1988, he sustained a [place] [address].  No other injuries since then.  He drives without GPS unless he goes to unfamiliar places or out of state.  He denies any headaches, double vision, dizziness, focal numbness or tingling, unilateral weakness or tremors, urine incontinence or retention, constipation or diarrhea.  He denies anosmia.  He denies a history of alcohol.  He smokes about half pack a day, decreased from 6 years ago when he was at 1-1/2 packs/day.  He is planning to quit.  He has a prior history of sleep apnea, had a sleep study at the time, did not like the CPAP, so he lost weight and his symptoms improved.  Family history remarkable for dementia in mother.  ? ? ? Neuropsych evaluation Dr. Melvyn Novas 11/09/21 Mild Neurocognitive Disorder ("mild cognitive impairment") ?dysfunction is unclear at the present time. Despite this, consideration should be given to the presence of Alzheimer's disease given ongoing memory impairment.A repeat neuropsychological evaluation in 12-18 months .[name] could discuss medication options to address memory loss and cognitive decline. No more  drinking  ? ? ? ? ?PREVIOUS MEDICATIONS:  ? ?CURRENT MEDICATIONS:  ?Outpatient Encounter Medications as of 12/04/2021  ?Medication Sig  ? acetaminophen (TYLENOL) 500 MG tablet Take 1,000 mg by mouth every 6 (six) hours as needed for mild pain, moderate pain, fever or headache.  ? alendronate (FOSAMAX) 70 MG tablet   ? aspirin EC 81 MG tablet Take 1 tablet (81 mg total) by mouth daily. Swallow whole.  ? atorvastatin (LIPITOR) 20 MG tablet Take 20 mg by mouth at bedtime.  ? clotrimazole-betamethasone (LOTRISONE) cream   ? donepezil (ARICEPT) 10 MG tablet Take half tablet (5 mg) daily for 2 weeks, then increase to the full tablet at 10 mg daily  ? fluticasone (FLONASE) 50 MCG/ACT nasal spray Place into the nose.  ? valACYclovir (VALTREX) 1000 MG tablet Take by mouth.  ? ?No facility-administered encounter medications on file as of  12/04/2021.  ? ? ? ?Objective:  ?  ? ?PHYSICAL EXAMINATION:   ? ?VITALS:   ?Vitals:  ? 12/04/21 1051  ?BP: 115/67  ?Pulse: 75  ?Resp: 18  ?SpO2: 96%  ?Weight: 172 lb (78 kg)  ?Height: '6\' 3"'$  (1.905 m)  ? ? ?GEN:  The patient appears stated age and is in NAD. ?HEENT:  Normocephalic, atraumatic.  ? ?Neurological examination: ? ?General: NAD, well-groomed, appears stated age. ?Orientation: The patient is alert. Oriented to person, place and date ?Cranial nerves: There is good facial symmetry.The speech is fluent and clear. No aphasia or dysarthria. Fund of knowledge is appropriate. Recent and remote memory are impaired. Attention and concentration are reduced.  Able to name objects and repeat phrases.  Hearing is intact to conversational tone.    ?Sensation: Sensation is intact to light touch throughout ?Motor: Strength is at least antigravity x4. ?Tremors: none  ?DTR's 2/4 in UE/LE  ? ? ?  06/05/2021  ? 12:00 PM  ?Montreal Cognitive Assessment   ?Visuospatial/ Executive (0/5) 1  ?Naming (0/3) 3  ?Attention: Read list of digits (0/2) 2  ?Attention: Read list of letters (0/1) 1  ?Attention: Serial 7  subtraction starting at 100 (0/3) 3  ?Language: Repeat phrase (0/2) 2  ?Language : Fluency (0/1) 1  ?Abstraction (0/2) 2  ?Delayed Recall (0/5) 0  ?Orientation (0/6) 5  ?Total 20  ?Adjusted Score (based on educ

## 2021-12-05 ENCOUNTER — Encounter: Payer: Self-pay | Admitting: Physician Assistant

## 2021-12-09 DIAGNOSIS — J329 Chronic sinusitis, unspecified: Secondary | ICD-10-CM | POA: Diagnosis not present

## 2021-12-12 ENCOUNTER — Ambulatory Visit (INDEPENDENT_AMBULATORY_CARE_PROVIDER_SITE_OTHER): Payer: Medicare Other

## 2021-12-12 DIAGNOSIS — Z0000 Encounter for general adult medical examination without abnormal findings: Secondary | ICD-10-CM

## 2021-12-12 NOTE — Progress Notes (Signed)
Appointment Outcome: Completed, Session #: 2 ?Start time: 11:07am   End time: 11:45am   Total Mins: 38 minutes ? ? ? ?AGREEMENTS SECTION ? ? ? ?Overall Goal(s): ?Smoking cessation - Quit date TBD                                          ?  ?Agreement/Action Steps:  ?Maintain smoking 8-10 cigarettes per day  ?Continue to smoke half a cigarette at a time and wait at least 30 minutes before smoking the other half   ?Practice mini quits - Extend the wait time between smoking cigarettes as long as possible  ?Use lozenges to deter smoking when cravings arise   ?Read the Quit4Life booklet and start working through the activities   ? ? ?Progress Notes:  ?Patient has been keeping track of his smoking behavior and mini quits using the information from the Quit4Life booklet. Patient shared his notebook with his progress from his last session. Patient has managed to smoke only 1/4 cigarette on some occasions and 1/2 cigarette majority of the time. Patient did smoke 3/4 cigarette on a few occasions. Patient has been able to decrease his smoking from 3/29 from 7.25 cigarettes to 2.25 cigarettes by 4/9.  ? ?Patient is smoking on average between 3-5 cigarettes per day. Patient noticed that the busier he is the longer he can practice his mini quits. Patient has been able to go approximately 5 hours without smoking one day, with the average span of time being 45 minutes to 1 hour. Patient's shorter time frame occurs typically in the morning. Patient shared that he typically wants a cigarette in the morning.  ? ?Patient stated that he never consciously thought about his smoking behavior until he began tracking when and how much he smoke. Patient mentioned that he asked himself do he need to smoke right now and telling himself that he does not have to smoke a whole cigarette. Patient recognizes that his smoking is out of habit and internal urges at times when he's hungry or just finished eating. Patient shared that he has not started  using lozenges to deter smoking when cravings arise. Patient stated that he will start tomorrow to see how that will help him prolong the time between smoking.  ? ?Patient stated that he feels motivated from the progress that he has made over the past two weeks. Patient expressed that he is somewhat surprised at himself and his ability to make the progress that he has made. Patient stated that a lesson he learned that sometimes going three hours without smoking is a stretch and the desire to smoke is present, but breaking smoking into smaller time increments worked.  ? ?Indicators of Success and Accountability:  Patient stated that he had the realization that he didn't need to smoke a whole cigarette and was able to minimize his smoking to 1/4 cigarette at a time on some occasions.  ?Readiness: Patient is in the action phase of smoking cessation.  ?Strengths and Supports: Patient is being supported by his wife. Patient is strategic and conscious of his smoking behaviors. ?Challenges and Barriers: Patient does not foresee any challenges to implementing his action steps over the next two weeks.  ? ?Coaching Outcomes: ?Patient stated that he will start using lozenges tomorrow.  ? ?Patient stated that he will try using a lozenge in the morning instead of smoking right away.  ? ?  Patient will also use this strategy after eating to deter smoking and increase the time during his mini quits.  ? ?Patient will implement his action steps as outlined below over the next two weeks.  ? ?Patient has not set a quit date. Patient wants to continue reducing his smoking over time.  ? ?Agreement/Action Steps:  ?Aim to smoke no more than 5 cigarettes per day  ?Continue to smoke half a cigarette at a time  ?Practice mini quits - Extend the wait time between smoking cigarettes as long as possible  ?Use lozenges to deter smoking when cravings arise  ?Start morning with a lozenge instead of smoking ?Use after eating meals   ? ? ?Attempted: ?Fulfilled - Patient was able to maintain a lower smoking range than agreed upon. Patient was able to practice mini quits for extended periods of time.  ?Partial - Patient did smoke 3/4 cigarette on some occasions. ?Not met - Patient has not started using the lozenges yet.  ? ? ? ?

## 2021-12-13 DIAGNOSIS — H6123 Impacted cerumen, bilateral: Secondary | ICD-10-CM | POA: Diagnosis not present

## 2021-12-26 ENCOUNTER — Ambulatory Visit (INDEPENDENT_AMBULATORY_CARE_PROVIDER_SITE_OTHER): Payer: Medicare Other

## 2021-12-26 DIAGNOSIS — Z0000 Encounter for general adult medical examination without abnormal findings: Secondary | ICD-10-CM

## 2021-12-26 NOTE — Progress Notes (Signed)
Appointment Outcome: Completed, Session #: 3 ?Start time: 11:06am   End time: 11:36am   Total Mins: 30 minutes ? ?AGREEMENTS SECTION ? ? ? ?Overall Goal(s): ?Smoking cessation - Quit date TBD ?  ?Agreement/Action Steps:  ?Aim to smoke no more than 5 cigarettes per day  ?Continue to smoke half a cigarette at a time  ?Practice mini quits - Extend the wait time between smoking cigarettes as long as possible  ?Use lozenges to deter smoking when cravings arise  ?Start morning with a lozenge instead of smoking ?Use after eating meals  ? ?Progress Notes:  ?Patient stated that in the past two weeks, he experienced some stress, but it didn't influence him being able to reduce his smoking during this time. Patient stated that he has been able to reduce his smoking to 5-7 cigarettes per day. Patient stated that at times he takes a few puffs of a cigarette and put it out but doesn't smoke more than 1/2 a cigarette at a time.  ? ?Patient is practicing mini quits throughout the day, which ranges from 30 minutes to 5 or 6 hours between each 1/2 cigarette. Patient stated that being busy like working in the yard helps him prolong the time between smoking. Patient stated that during the stressful times, he smoked more frequently but still managed to reduce his smoking below 8-10 cigarettes.  ? ?Patient mentioned that he has tried using lozenges on two or three occasions but stopped using them because of the taste. Patient stated that they did work to curb the cravings. Patient stated that he used them when he had a craving 30 minutes after smoking 1/2 a cigarette or at times when he tried to deter himself from smoking. Patient has not used lozenges to replace the first cigarette of the day or after eating yet. ? ? ?Indicators of Success and Accountability:  Patient has been able to reduce his smoking to 5-7 cigarettes per day. ?Readiness: Patient is in the action phase of smoking cessation. ?Strengths and Supports: Patient is being  supported by his family. Patient is persistent with working towards smoking cessation.  ?Challenges and Barriers: Patient does not foresee any challenges implementing his action steps moving forward.  ? ? ?Coaching Outcomes: ?Patient will purchase another flavor/brand of lozenges to see if they taste better, so he can use them effectively to deter smoking.  ? ?Patient has set a quite date for February 01, 2022.  ? ?Patient will implement the following action steps as outlined below over the next two weeks.  ? ?Agreement/Action Steps:  ?Aim to smoke no more than 5 cigarettes per day  ?Continue to smoke half a cigarette at a time  ?Practice mini quits - Extend the wait time between smoking cigarettes as long as possible  ?Track smoking progress daily ?Use lozenges to deter smoking when cravings arise  ?Start morning with a lozenge instead of smoking ?Use after eating meals  ? ? ?Attempted: ?Fulfilled - Patient was able to smoke 1/2 cigarette of less at a time. Patient has been able to practice mini quits with no issues.  ?Partial - Patient was able to smoke 5 cigarettes on some days. Patient has tried using lozenges to deter smoking but not consistently over the past two weeks.  ? ? ? ? ?

## 2022-01-05 ENCOUNTER — Telehealth: Payer: Self-pay

## 2022-01-05 DIAGNOSIS — Z0000 Encounter for general adult medical examination without abnormal findings: Secondary | ICD-10-CM

## 2022-01-05 NOTE — Telephone Encounter (Signed)
Patient called wanting to reschedule his May 10th appt.  ?

## 2022-01-05 NOTE — Telephone Encounter (Signed)
Returned patient's call to reschedule health coaching appointment. Patient stated that he will be going out of town and would need to reschedule his appointment for after May 21,2013. Patient has been rescheduled for an in-person health coaching session on May 24th at 8:30am. ? ?Avelino Leeds, [place] ?[facility] ?Care Guide, Health Coach ?[address] ?[place] [address] ?Telephone: [phone] ?Email: [email or web address] ? ?

## 2022-01-10 ENCOUNTER — Ambulatory Visit: Payer: Medicare Other

## 2022-01-24 ENCOUNTER — Ambulatory Visit (INDEPENDENT_AMBULATORY_CARE_PROVIDER_SITE_OTHER): Payer: Medicare Other

## 2022-01-24 DIAGNOSIS — Z0000 Encounter for general adult medical examination without abnormal findings: Secondary | ICD-10-CM

## 2022-01-24 NOTE — Progress Notes (Signed)
Appointment Outcome: Completed, Session #: 4 Start time: 8:36am   End time: 9:06am   Total Mins: 30 minutes  AGREEMENTS SECTION    Overall Goal(s): Smoking cessation - Quit Date (February 01, 2022)                                             Agreement/Action Steps:  Aim to smoke no more than 5 cigarettes per day  Continue to smoke half a cigarette at a time  Practice mini quits - Extend the wait time between smoking cigarettes long as possible  Track smoking progress daily Use lozenges to deter smoking when cravings arise  Start morning with a lozenge instead of smoking Use after eating meals     Progress Notes:  Patient last visit was on April 25th due to going on vacation and rescheduling his 4th session. Patient was able to continue tracking his smoking progress daily and brought his notebook with him to share his progress today. Patient has managed to not smoke more than 5 cigarettes per day. Patient is smoking between 2-3.25 cigarettes per day.   Patient stated that how many cigarettes he smokes per day depends on what he is doing and how busy he is that day. Patient reported that he has not smoked a half cigarette at a time in a long time. Patient mentioned that he takes a few puffs to smoking about a quarter of a cigarette at a time. Patient shared some of the time frames that he has gone between smoking.   Patient recorded times indicated that the minimum amount of time that he can practice mini quits range from 30 minutes in the morning to 3 hours and 45 minutes at the longest during the day/evening. Patient stated that during the longer periods of times he may be doing yard work. Other activities he may be engaged in during his mini quits are reading, watching tv, interacting with the dogs, and going for walks.   Patient stated that he doesn't smoke immediately after waking up often. Patient shared that normally he showers and get dressed first, which takes about 30 minutes before  smoking. Patient shared that he may think about smoking, but he will talk to himself and tell himself that he will wait and will concentrate on something else.   Patient mentioned that he randomly snacks throughout the day and do want to smoke afterwards. Patient stated that after dinner, he will help with washing dishes and will smoke afterwards. Patient shared that he has tried the lozenges but not after eating or after waking up. Patient expressed that they don't taste good, so he has not used them frequently, but they do help.     Indicators of Success and Accountability:  Patient was able to smoke less than 5 cigarettes per day.  Readiness: Patient is in the action phase of smoking cessation.  Strengths and Supports: Patient is being supported by his wife. Patient is disciplined and  Challenges and Barriers: Patient does not foresee any challenges/barriers to implementing his actions steps over the next two weeks.    Coaching Outcomes: Suggested patient try a different brand of lozenges to see if they taste better or try Nicorette gum as an alternative to help deter smoking when cravings occur in the morning and after eating.   Patient will challenge himself to smoke no more than 2  cigarettes per day over the next two weeks.   Patient will continue to use positive self-talk to encourage themselves to extend mini quits. Patient will also engage in activities of their choice to help distract themselves from smoking after eating, such as washing dishes or taking a 5-minute walk.   Patient will have two additional sessions added to extend health coaching to July 19th due to missing session because of vacation. Patient is reconsidering quit date.   Patient will implement the following action steps as outlined below over the next two weeks.    Overall Goal(s): Smoking cessation - Quit Date (TBD)                                             Agreement/Action Steps:  Aim to smoke no more than  2 cigarettes per day  Use lozenges/Nicorette gum to deter smoking when cravings arise  Start morning with a lozenge instead of smoking Use after eating meals Continue to smoke less than half a cigarette at a time  Practice mini quits - Extend the wait time between smoking cigarettes long as possible  Track smoking progress daily Implement positive self-talk to assist with deterring smoking and extending mini quits Engage in activity such as walking for 5 minutes or washing dishes to deter smoking after eating    Attempted: Fulfilled - Patient was able to maintain not smoking more than 5 cigarettes per day, smoked less than half a cigarette at a time, practiced mini quits, and tracked his smoking progress daily.  Partial - Patient tried using lozenges but not after eating meals or first thing in the morning.

## 2022-01-25 ENCOUNTER — Other Ambulatory Visit: Payer: Self-pay | Admitting: *Deleted

## 2022-01-25 DIAGNOSIS — I714 Abdominal aortic aneurysm, without rupture, unspecified: Secondary | ICD-10-CM

## 2022-01-25 DIAGNOSIS — I723 Aneurysm of iliac artery: Secondary | ICD-10-CM

## 2022-02-06 ENCOUNTER — Ambulatory Visit (HOSPITAL_COMMUNITY)
Admission: RE | Admit: 2022-02-06 | Discharge: 2022-02-06 | Disposition: A | Discharge status: Home / Self Care | Payer: Medicare Other | Source: Ambulatory Visit | Attending: Vascular Surgery | Admitting: Vascular Surgery

## 2022-02-06 ENCOUNTER — Ambulatory Visit
Admission: RE | Admit: 2022-02-06 | Discharge: 2022-02-06 | Disposition: A | Discharge status: Home / Self Care | Payer: Medicare Other | Source: Ambulatory Visit | Attending: Acute Care | Admitting: Acute Care

## 2022-02-06 ENCOUNTER — Ambulatory Visit: Payer: Medicare Other | Admitting: Vascular Surgery

## 2022-02-06 ENCOUNTER — Telehealth: Payer: Self-pay

## 2022-02-06 DIAGNOSIS — F17210 Nicotine dependence, cigarettes, uncomplicated: Secondary | ICD-10-CM | POA: Diagnosis not present

## 2022-02-06 DIAGNOSIS — I723 Aneurysm of iliac artery: Secondary | ICD-10-CM | POA: Diagnosis not present

## 2022-02-06 DIAGNOSIS — Z87891 Personal history of nicotine dependence: Secondary | ICD-10-CM

## 2022-02-06 DIAGNOSIS — I714 Abdominal aortic aneurysm, without rupture, unspecified: Secondary | ICD-10-CM | POA: Insufficient documentation

## 2022-02-06 DIAGNOSIS — Z0000 Encounter for general adult medical examination without abnormal findings: Secondary | ICD-10-CM

## 2022-02-06 NOTE — Telephone Encounter (Signed)
Called patient to reschedule health coaching session due to having a meeting with supervisor during the time of his appointment. Patient did not answer. Left message for patient to return call to confirm that he is okay with being rescheduled to 10:00am the same day (02/07/22) instead of his original visit time of 8:30am.  Raymond Robinson, [place], Health Coach [address] [place] [address] Telephone: [phone] Email: [email or web address]

## 2022-02-07 ENCOUNTER — Ambulatory Visit: Payer: Medicare Other

## 2022-02-07 ENCOUNTER — Ambulatory Visit (INDEPENDENT_AMBULATORY_CARE_PROVIDER_SITE_OTHER): Payer: Medicare Other

## 2022-02-07 DIAGNOSIS — Z0000 Encounter for general adult medical examination without abnormal findings: Secondary | ICD-10-CM

## 2022-02-07 NOTE — Progress Notes (Signed)
Patient returned call and left msg confirming that he would like to reschedule for today at 10:30am after leaving him a msg yesterday informing him that I needed to reschedule our session from 8:30am due to a monthly meeting. Patient's health coaching appointment has been rescheduled for an in-person visit at 10:30am.   Avelino Leeds, [place], Health Coach [address], [address] [place] [address] Telephone: [phone] Email: [email or web address]

## 2022-02-07 NOTE — Progress Notes (Signed)
Appointment Outcome: Completed, Session #: 5 Start time: 10:00am   End time: 10:32am   Total Mins: 32 minutes  AGREEMENTS SECTION    Overall Goal(s): Smoking Cessation - Quit Date: TBD  Agreement/Action Steps:  Aim to smoke no more than 2 cigarettes per day  Use lozenges/Nicorette gum to deter smoking when cravings arise  Start morning with a lozenge instead of smoking Use after eating meals Continue to smoke less than half a cigarette at a time  Practice mini quits - Extend the wait time between smoking cigarettes long as possible  Track smoking progress daily Implement positive self-talk to assist with deterring smoking and extending mini quits Engage in activity such as walking for 5 minutes or washing dishes to deter smoking after eating    Progress Notes:  Patient stated that he has been tracking when he smokes and how much but forgot to bring his log. Patient reported that he is smoking less than 5 cigarettes per day, with an average between 3-4 cigarettes daily. Patient stated that he is not smoking an entire cigarette at one time and typically stick to smoking 1/4 cigarette by taking a few puffs to satisfy the craving.   Patient mentioned that he has been using the lozenges throughout the day, but still does not like the taste. Patient stated that he smokes in the morning because the craving is intense but has not tried using a lozenge instead of smoking. Patient shared that the longest time his mini quits last is 4 hours and that is when he is busy doing yard work, or he was assisting with remodeling of his daughter's home. Patient explained that his shortest mini quit lasts 1 hour. Discussed with patient the implementation of using positive self-talk to encourage himself to extend his mini quits when he is not busy, and/or combining the strategy with the use of a lozenges.   Patient shared that he has been walking with his wife but not after eating. Discussed with patient another  alternative that will help distract him from smoking after eating. Patient is interested in playing Solitaire after meals to help him avoid smoking. Patient stated that it is easy to make excuses to smoke, but he is determined to quit.   Patient expressed that he is happy with the progress that he has made with working towards smoking cessation. Suggested to patient that he take out the 3 allotted cigarettes per day and put the pack out of sight to deter smoking more than the goal. Patient stated that he liked that idea.   Discussed with patient his "Why" for wanting to quit smoking. Patient expressed that he's doing this for his health, the dislike for the smell by others, and the dirtiness of smoking. Asked patient had he thought about setting an official quit date. Patient stated that he was giving himself another month. Patient's quit date has been set for March 07, 2022.   Indicators of Success and Accountability:  Readiness: Patient is in the action phase of smoking cessation. Strengths and Supports: Patient is being supported by his wife. Patient is confident and determined to meet his quit date.  Challenges and Barriers: Patient does not foresee any challenges/barriers to implementing her action steps over the next two weeks.     Coaching Outcomes: Patient action steps has been revised for the next two weeks as outlined below.    Overall Goal(s): Smoking Cessation - Quit Date: July 5th  Agreement/Action Steps:  Aim to smoke no more  than 3 cigarettes per day  Take 3 cigarettes out the pack daily and put the pack out of sight  Aim to smoke  cigarette at a time Use lozenges/Nicorette gum to deter smoking when cravings arise  Start morning with a lozenge instead of smoking Use after eating meals  Practice mini quits - Extend the wait time between smoking cigarettes long as possible  Implement positive self-talk to assist with deterring smoking and extending mini quits Play Solitaire  after eating to deter smoking  Track smoking progress daily    Patient has been scheduled for his 6th session on 6/21 at 10am and an additional session for 7/12 at 10am after his tentative quit date of 7/5.  Patient emailed a copy of his updated action steps and quit date.  Attempted: Fulfilled - Patient did practice mini quits, track his smoking progress daily, and implement positive self-talk. Partial - Patient has used lozenges throughout the day but not in the morning or consistently after eating. Patient walked as an activity but not after eating.  Not met - Patient was not able to smoke no more than 2 cigarettes per day.

## 2022-02-08 ENCOUNTER — Other Ambulatory Visit: Payer: Self-pay | Admitting: Acute Care

## 2022-02-08 ENCOUNTER — Telehealth: Payer: Self-pay | Admitting: Acute Care

## 2022-02-08 DIAGNOSIS — R911 Solitary pulmonary nodule: Secondary | ICD-10-CM

## 2022-02-08 NOTE — Telephone Encounter (Signed)
Results/plan faxed to PCP 

## 2022-02-08 NOTE — Telephone Encounter (Signed)
Reviewed - agree with PET The new medial nodular opacity in in region that was focal emphysema on prior scan - ? Now fluid filled or with thickening cavity walls

## 2022-02-08 NOTE — Progress Notes (Signed)
PET scan has been ordered . Patient will need follow up with Dr. Lamonte Sakai or [facility]. See Telephone note

## 2022-02-08 NOTE — Telephone Encounter (Signed)
I have called the patient with the results of his low dose CT Chest. I explained that his scan was read as a Lung RADS 4 B which indicates suspicious findings for which additional diagnostic testing and or tissue sampling is recommended. I told him there is a New paraspinal solid nodule of the right lower lobe associated with an area of paraseptal emphysema measuring 14 mm in mean diameter that we need to evaluate more closely. I explained that I will order a PET scan today, and that he should get a call today to get this scheduled. We will have him follow up with Dr. Valeta Harms of [facility] after the PET scan has been scheduled. Langley Gauss, will you watch to see when he gets scheduled, so that we can get a close follow up appointment with [facility] in a nodule slot? Also, fax results to PCP and let him know plan is for a PET scan to better evaluate new 14 mm  nodule.  PET has been ordered. Thanks so much  I have sent [facility] the scan to review.

## 2022-02-15 ENCOUNTER — Ambulatory Visit (HOSPITAL_COMMUNITY)
Admission: RE | Admit: 2022-02-15 | Discharge: 2022-02-15 | Disposition: A | Discharge status: Home / Self Care | Payer: Medicare Other | Source: Ambulatory Visit | Attending: Acute Care | Admitting: Acute Care

## 2022-02-15 DIAGNOSIS — R911 Solitary pulmonary nodule: Secondary | ICD-10-CM

## 2022-02-15 DIAGNOSIS — R918 Other nonspecific abnormal finding of lung field: Secondary | ICD-10-CM | POA: Diagnosis not present

## 2022-02-15 LAB — GLUCOSE, CAPILLARY: Glucose-Capillary: 90 mg/dL (ref 70–99)

## 2022-02-15 MED ORDER — FLUDEOXYGLUCOSE F - 18 (FDG) INJECTION
8.5600 | Freq: Once | INTRAVENOUS | Status: AC | PRN
  Administered 2022-02-15: 8.56 via INTRAVENOUS

## 2022-02-20 ENCOUNTER — Other Ambulatory Visit: Payer: Self-pay

## 2022-02-20 ENCOUNTER — Encounter: Payer: Self-pay | Admitting: Acute Care

## 2022-02-20 ENCOUNTER — Ambulatory Visit (INDEPENDENT_AMBULATORY_CARE_PROVIDER_SITE_OTHER): Payer: Medicare Other | Admitting: Acute Care

## 2022-02-20 VITALS — BP 102/62 | HR 52 | Temp 97.8°F | Ht 75.0 in | Wt 171.6 lb

## 2022-02-20 DIAGNOSIS — Z87891 Personal history of nicotine dependence: Secondary | ICD-10-CM | POA: Diagnosis not present

## 2022-02-20 DIAGNOSIS — F17210 Nicotine dependence, cigarettes, uncomplicated: Secondary | ICD-10-CM

## 2022-02-20 DIAGNOSIS — I2510 Atherosclerotic heart disease of native coronary artery without angina pectoris: Secondary | ICD-10-CM | POA: Diagnosis not present

## 2022-02-20 DIAGNOSIS — Z122 Encounter for screening for malignant neoplasm of respiratory organs: Secondary | ICD-10-CM

## 2022-02-20 DIAGNOSIS — R911 Solitary pulmonary nodule: Secondary | ICD-10-CM

## 2022-02-20 NOTE — Progress Notes (Signed)
History of Present Illness Raymond Robinson is a 66 y.o. male current every day smoker with abnormal Low Dose CT Chest ( Lung cancer screening). Screening scan was read as a LR 4 B. PET scan was ordered as size of nodule of concern was 14 mm. Pt. Is here for follow up to discuss PET results and plan of care.    02/20/2022 Pt. Presents for follow up after PET scan. PET scan showed no hypermetabolism to suggest a neoplastic process, probable resolving area of atelectasis or inflammation surrounding the bulla in the right lower lobe. Recommendation is for a 6 month follow up non-contrast CT Chest.  We discussed recommendations and also smoking cessation.   While patient was here VS were noted as HR of 52 and BP of 102/62. Pt. States this is low for him. He has not had any dizziness, or symptoms of low BP. He has recently started Arisept , and we are wondering if this may be the cause. I have asked him to check with Sharene Butters He has quit smoking as of June 12th 2023.   Test Results: PET Scan 02/15/2022   1. Probable resolving area atelectasis or inflammation surrounding the bulla in the right lower lobe. No hypermetabolism to suggest a neoplastic process. Recommend follow-up noncontrast chest CT in 6 months. 2. No mediastinal or hilar lymphadenopathy. 3. Stable age advanced vascular disease and emphysema. 4. 4.1 x 3.6 cm infrarenal abdominal aortic aneurysm and 3.2 cm left common iliac artery aneurysm. Recommend continued surveillance as above.  Low Dose Screening CT Chest 02/06/2022 New paraspinal solid nodule of the right lower lobe measuring 14 mm in mean diameter. Lung-RADS 4B, suspicious. Additional imaging evaluation or consultation with Pulmonology or Thoracic Surgery recommended. 2. Coronary artery calcifications, aortic Atherosclerosis (ICD10-I70.0) and Emphysema (ICD10-J43.9).     Latest Ref Rng & Units 12/28/2016    6:23 PM  CBC  WBC 4.0 - 10.5 K/uL 14.2   Hemoglobin 13.0  - 17.0 g/dL 15.0   Hematocrit 39.0 - 52.0 % 41.2   Platelets 150 - 400 K/uL 252        Latest Ref Rng & Units 12/28/2016    6:23 PM  BMP  Glucose 65 - 99 mg/dL 92   BUN 6 - 20 mg/dL 15   Creatinine 0.61 - 1.24 mg/dL 0.79   Sodium 135 - 145 mmol/L 132   Potassium 3.5 - 5.1 mmol/L 4.1   Chloride 101 - 111 mmol/L 94   CO2 22 - 32 mmol/L 29   Calcium 8.9 - 10.3 mg/dL 9.6     BNP No results found for: "BNP"  ProBNP No results found for: "PROBNP"  PFT No results found for: "FEV1PRE", "FEV1POST", "FVCPRE", "FVCPOST", "TLC", "DLCOUNC", "PREFEV1FVCRT", "PSTFEV1FVCRT"  NM PET Image Initial (PI) Skull Base To Thigh  Result Date: 02/17/2022 CLINICAL DATA:  Initial treatment strategy for lung nodule on lung cancer screening CT scan. EXAM: NUCLEAR MEDICINE PET SKULL BASE TO THIGH TECHNIQUE: 8.56 mCi F-18 FDG was injected intravenously. Full-ring PET imaging was performed from the skull base to thigh after the radiotracer. CT data was obtained and used for attenuation correction and anatomic localization. Fasting blood glucose: 90 mg/dl COMPARISON:  Multiple prior chest CTs. The most recent is 02/06/2022 FINDINGS: Mediastinal blood pool activity: SUV max 1.56 Liver activity: SUV max NA NECK: No hypermetabolic lymph nodes in the neck. Incidental CT findings: none CHEST: Stable bullous changes in the medial right lower lobe/azygoesophageal recess. The soft tissue nodularity around  this area seen on the prior CT scan now has more of a ground-glass type opacity. This is most likely a resolving area inflammation or atelectasis. No hypermetabolism to suggest a neoplastic process. No enlarged or hypermetabolic mediastinal or hilar lymph nodes. No new worrisome pulmonary lesions. The small sub solid nodule seen in the right upper lobe on the prior study is largely resolved. Incidental CT findings: Stable underlying emphysematous changes and areas of pulmonary scarring. Stable age advanced atherosclerotic  calcifications involving the aorta and coronary arteries. ABDOMEN/PELVIS: No abnormal hypermetabolic activity within the liver, pancreas, adrenal glands, or spleen. No hypermetabolic lymph nodes in the abdomen or pelvis. Moderate hypermetabolism in the fundal region of the stomach is likely physiologic. No CT abnormality is identified. Incidental CT findings: Age advanced atherosclerotic calcifications involving the abdominal aorta and branch vessels. There is a 4.1 x 3.6 cm infrarenal abdominal aortic aneurysm. Slight interval enlargement since 06/20/2020 CT scan (3.7 x 3.4 cm). Recommend follow-up every 12 months and vascular consultation. This recommendation follows ACR consensus guidelines: White Paper of the ACR Incidental Findings Committee II on Vascular Findings. J Am Coll Radiol 2013; 10:789-794. Stable left common iliac artery aneurysm measuring 3.2 cm. SKELETON: No focal hypermetabolic activity to suggest skeletal metastasis. Incidental CT findings: none IMPRESSION: 1. Probable resolving area atelectasis or inflammation surrounding the bulla in the right lower lobe. No hypermetabolism to suggest a neoplastic process. Recommend follow-up noncontrast chest CT in 6 months. 2. No mediastinal or hilar lymphadenopathy. 3. Stable age advanced vascular disease and emphysema. 4. 4.1 x 3.6 cm infrarenal abdominal aortic aneurysm and 3.2 cm left common iliac artery aneurysm. Recommend continued surveillance as above. Electronically Signed   By: Marijo Sanes M.D.   On: 02/17/2022 09:31   CT CHEST LUNG CA SCREEN LOW DOSE W/O CM  Result Date: 02/07/2022 CLINICAL DATA:  Current smoker with 52 pack-year history EXAM: CT CHEST WITHOUT CONTRAST LOW-DOSE FOR LUNG CANCER SCREENING TECHNIQUE: Multidetector CT imaging of the chest was performed following the standard protocol without IV contrast. RADIATION DOSE REDUCTION: This exam was performed according to the departmental dose-optimization program which includes  automated exposure control, adjustment of the mA and/or kV according to patient size and/or use of iterative reconstruction technique. COMPARISON:  Lung cancer screening CT dated February 06, 2021 FINDINGS: Cardiovascular: Normal heart size. Left main and three-vessel coronary artery calcifications. Atherosclerotic disease of the thoracic aorta. Mediastinum/Nodes: Esophagus and thyroid are unremarkable. Pathologically enlarged lymph nodes seen in the chest. Lungs/Pleura: Central airways are patent. Mild paraseptal emphysema. New paraspinal solid nodule of the right lower lobe associated with an area of paraseptal emphysema measuring 14 mm in mean diameter on image 176. New small non solid nodule of the right upper lobe measuring 6.1 mm in mean diameter on image 99. Other previously seen nodules are stable. Upper Abdomen: No acute abnormality. Musculoskeletal: Multiple mild compression deformities of the thoracic spine, unchanged when compared to prior exam. No aggressive appearing osseous lesions. IMPRESSION: 1. New paraspinal solid nodule of the right lower lobe measuring 14 mm in mean diameter. Lung-RADS 4B, suspicious. Additional imaging evaluation or consultation with Pulmonology or Thoracic Surgery recommended. 2. Coronary artery calcifications, aortic Atherosclerosis (ICD10-I70.0) and Emphysema (ICD10-J43.9). These results will be called to the ordering clinician or representative by the Radiologist Assistant, and communication documented in the PACS or [employer]. Electronically Signed   By: Yetta Glassman M.D.   On: 02/07/2022 17:05  VAS [place] AAA DUPLEX  Result Date: 02/06/2022 ABDOMINAL AORTA STUDY  Patient Name:  TYRIQ MORAGNE  Date of Exam:   02/06/2022 Medical Rec #: [id number]        Accession #:    [id number] Date of Birth: 1955-09-17        Patient Gender: M Patient Age:   18 years Exam Location:  [facility] Procedure:      VAS [place] AAA DUPLEX Referring Phys: Jamelle Haring  --------------------------------------------------------------------------------  Indications: Follow up exam for known AAA. Limitations: Air/bowel gas and patient discomfort.  Performing Technologist: Ronal Fear RVS, RCS  Examination Guidelines: A complete evaluation includes B-mode imaging, spectral Doppler, color Doppler, and power Doppler as needed of all accessible portions of each vessel. Bilateral testing is considered an integral part of a complete examination. Limited examinations for reoccurring indications may be performed as noted.  Abdominal Aorta Findings: +-----------+-------+----------+----------+--------+--------+--------+ Location   AP (cm)Trans (cm)PSV (cm/s)WaveformThrombusComments +-----------+-------+----------+----------+--------+--------+--------+ Proximal   2.45   2.26      41                                 +-----------+-------+----------+----------+--------+--------+--------+ Mid        3.52   3.84      84                                 +-----------+-------+----------+----------+--------+--------+--------+ Distal     3.01   3.05      90                                 +-----------+-------+----------+----------+--------+--------+--------+ RT CIA Prox2.0    2.1       95                                 +-----------+-------+----------+----------+--------+--------+--------+ LT CIA Mid 2.9    3.0       83                                 +-----------+-------+----------+----------+--------+--------+--------+ Visualization of the Mid Abdominal Aorta, Right CIA Proximal artery and Left CIA Proximal artery was limited.  Summary: Abdominal Aorta: There is evidence of abnormal dilatation of the mid Abdominal aorta. There is evidence of abnormal dilation of the Left Common Iliac artery and Right Common Iliac artery. The largest aortic measurement is 3.8 cm. The largest aortic diameter remains essentially unchanged compared to prior exam. Previous  diameter measurement was 4.0 cm obtained on 01/31/2021.  *See table(s) above for measurements and observations.  Electronically signed by Monica Martinez MD on 02/06/2022 at 10:50:03 AM.    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   Aneurysm of iliac artery 07/06/2020   Asthma    Atherosclerosis of renal artery    Atherosclerotic heart disease of native coronary artery without angina pectoris    Cigarette nicotine dependence without complication 18/56/3149   ETD (  Eustachian tube dysfunction), bilateral 01/11/2017   Hiatal hernia    Hyperlipidemia    Obstructive sleep apnea    no CPAP use   Personal history of colonic polyps 07/06/2020   Pilonidal cyst    Pure hypercholesterolemia    Renal artery stenosis 07/06/2020   Seasonal allergic rhinitis 01/11/2017   Sensorineural hearing loss (SNHL) of both ears 02/22/2017   Tinnitus, bilateral 02/22/2017     Social History   Tobacco Use   Smoking status: Former    Packs/day: 0.50    Years: 43.00    Total pack years: 21.50    Types: Cigarettes    Start date: 10/03/1966    Quit date: 02/12/2022    Years since quitting: 0.0   Smokeless tobacco: Never  Vaping Use   Vaping Use: Never used  Substance Use Topics   Alcohol use: Yes    Alcohol/week: 20.0 standard drinks of alcohol    Types: 20 Cans of beer per week    Comment: 4 beers per night on average, 5 nights per week   Drug use: No    Mr.Rybarczyk reports that he quit smoking 8 days ago. His smoking use included cigarettes. He started smoking about 55 years ago. He has a 21.50 pack-year smoking history. He has never used smokeless tobacco. He reports current alcohol use of about  20.0 standard drinks of alcohol per week. He reports that he does not use drugs.  Tobacco Cessation: Pt. Quit smoking 02/12/2022, so < 1 month quit  Past surgical hx, Family hx, Social hx all reviewed.  Current Outpatient Medications on File Prior to Visit  Medication Sig   acetaminophen (TYLENOL) 500 MG tablet Take 1,000 mg by mouth every 6 (six) hours as needed for mild pain, moderate pain, fever or headache.   alendronate (FOSAMAX) 70 MG tablet    aspirin EC 81 MG tablet Take 1 tablet (81 mg total) by mouth daily. Swallow whole.   atorvastatin (LIPITOR) 20 MG tablet Take 20 mg by mouth at bedtime.   clotrimazole-betamethasone (LOTRISONE) cream    donepezil (ARICEPT) 10 MG tablet Take half tablet (5 mg) daily for 2 weeks, then increase to the full tablet at 10 mg daily   fluticasone (FLONASE) 50 MCG/ACT nasal spray Place into the nose.   valACYclovir (VALTREX) 1000 MG tablet Take by mouth.   No current facility-administered medications on file prior to visit.     Allergies  Allergen Reactions   Penicillins Other (See Comments)    Reaction:  Unknown  Has patient had a PCN reaction causing immediate rash, facial/tongue/throat swelling, SOB or lightheadedness with hypotension: Unsure Has patient had a PCN reaction causing severe rash involving mucus membranes or skin necrosis: Unsure Has patient had a PCN reaction that required hospitalization Unsure Has patient had a PCN reaction occurring within the last 10 years: No If all of the above answers are "NO", then may proceed with Cephalosporin use. Reaction:  Unknown  Has patient had a PCN reaction causing immediate rash, facial/tongue/throat swelling, SOB or lightheadedness with hypotension: Unsure Has patient had a PCN reaction causing severe rash involving mucus membranes or skin necrosis: Unsure Has patient had a PCN reaction that required hospitalization Unsure Has patient had a PCN reaction occurring within the last 10 years:  No If all of the above answers are "NO", then may proceed with Cephalosporin use.    Review Of Systems:  Constitutional:   No  weight loss, night sweats,  Fevers, chills, fatigue, or  lassitude.  HEENT:   No headaches,  Difficulty swallowing,  Tooth/dental problems, or  Sore throat,                No sneezing, itching, ear ache, nasal congestion, post nasal drip,   CV:  No chest pain,  Orthopnea, PND, swelling in lower extremities, anasarca, dizziness, palpitations, syncope.   GI  No heartburn, indigestion, abdominal pain, nausea, vomiting, diarrhea, change in bowel habits, loss of appetite, bloody stools.   Resp: No shortness of breath with exertion or at rest.  No excess mucus, no productive cough,  No non-productive cough,  No coughing up of blood.  No change in color of mucus.  No wheezing.  No chest wall deformity  Skin: no rash or lesions.  GU: no dysuria, change in color of urine, no urgency or frequency.  No flank pain, no hematuria   MS:  No joint pain or swelling.  No decreased range of motion.  No back pain.  Psych:  No change in mood or affect. No depression or anxiety.  No memory loss.   Vital Signs BP 102/62 (BP Location: Left Arm, Patient Position: Sitting, Cuff Size: Normal)   Pulse (!) 52   Temp 97.8 F (36.6 C) (Oral)   Ht '6\' 3"'$  (1.905 m)   Wt 171 lb 9.6 oz (77.8 kg)   SpO2 98%   BMI 21.45 kg/m    Physical Exam:  General- No distress,  A&Ox3, pleasant and appropriate ENT: No sinus tenderness, TM clear, pale nasal mucosa, no oral exudate,no post nasal drip, no LAN Cardiac: S1, S2, regular rate and rhythm, no murmur Chest: No wheeze/ rales/ dullness; no accessory muscle use, no nasal flaring, no sternal retractions Abd.: Soft Non-tender, ND, BS +, Body mass index is 21.45 kg/m. Ext: No clubbing cyanosis, edema Neuro:  normal strength, MAE x 4. A&O x 3 Skin: No rashes, warm and dry, No lesions  Psych: normal mood and behavior   Assessment/Plan LR 4  B Screening CT Chest PET scan shows  Low HR and BP>> asymptomatic>> recent start of Aricept 01/2022 Plan Your PET scan shows resolving infection / inflammation, no concern for neoplastic process. We will do a 6 month follow up CT Chest without contrast. We will call you closer to the time to get this scheduled.  Your BP and Heart rate were low today in the office.  Please let  Sharene Butters at [facility] know, to evaluate if the initiation of Aricept may be the cause.  Congratulations on quitting smoking . This is the single most powerful action you can take to decrease your risk of heart disease, stroke, worsening pulmonary function and lung cancer.  Consider asking Dr. Raliegh Ip about albuterol inhaler for shortness of breath with exertion.  Please contact office for sooner follow up if symptoms do not improve or worsen or seek emergency care    I spent 35 minutes dedicated to the care of this patient on the date of this encounter to include pre-visit review of records, face-to-face time with the patient discussing conditions above, post visit ordering of testing, clinical documentation with the electronic health record, making appropriate referrals as documented, and communicating necessary information to the patient's healthcare team.      Magdalen Spatz, NP 02/20/2022  4:00 PM

## 2022-02-20 NOTE — Patient Instructions (Addendum)
It is good to see you today. Your PET scan shows resolving infection / inflammation, no concern for neoplastic process. We will do a 6 month follow up CT Chest without contrast. We will call you closer to the time to get this scheduled.  Your BP and Heart rate were low today in the office.  Please let  Sharene Butters at [facility] know, to evaluate if the initiation of Aricept may be the cause.  Congratulations on quitting smoking . This is the single most powerful action you can take to decrease your risk of heart disease, stroke, worsening pulmonary function and lung cancer.  Consider asking Dr. Raliegh Ip about albuterol inhaler for shortness of breath with exertion.  Please contact office for sooner follow up if symptoms do not improve or worsen or seek emergency care

## 2022-02-21 ENCOUNTER — Ambulatory Visit (INDEPENDENT_AMBULATORY_CARE_PROVIDER_SITE_OTHER): Payer: Medicare Other

## 2022-02-21 DIAGNOSIS — Z0000 Encounter for general adult medical examination without abnormal findings: Secondary | ICD-10-CM

## 2022-02-21 NOTE — Progress Notes (Signed)
Appointment Outcome: Completed, Session #: 6 Start time: 9:52am   End time: 10:12am   Total Mins: 20 minutes  AGREEMENTS SECTION    Overall Goal(s): Smoking Cessation - Quit Date: July 5th    Agreement/Action Steps:  Aim to smoke no more than 3 cigarettes per day  Take 3 cigarettes out the pack daily and put the pack out of sight  Aim to smoke  cigarette at a time Use lozenges/Nicorette gum to deter smoking when cravings arise  Start morning with a lozenge instead of smoking Use after eating meals  Practice mini quits - Extend the wait time between smoking cigarettes long as possible  Implement positive self-talk to assist with deterring smoking and extending mini quits Play Solitaire after eating to deter smoking Track smoking progress daily    Progress Notes:  Patient stated that he officially quit smoking on February 12, 2022. Patient stated that prior to him quitting, he continued to keep record of how many cigarettes he smoked, how much of the cigarette he smoked, and at what time he smoked. Patient stated that this strategy really helped him have a visual to put into perspective his smoking behavior.   Patient shared that once he got low in the number of cigarettes he smoked daily (approximately 2 or less), he questioned if he wasn't smoking any more than that then why he was still smoking. Patient mentioned that he thought that he had one cigarette left in the pack but did not and he decided that he was no longer going to smoke. Patient has not purchased another pack of cigarettes since and has not really thought about smoking.   Patient mentioned that he has used lozenges but not more than 1-2 a day. Patient shared that he takes lozenges with him just in case he needs them. Patient was able to adhere to smoking 1/4 cigarette to a few puffs at a time and was able to extend his mini quits even longer since he cut down on his number of allotted cigarettes allowed daily. Patient  utilized positive self-talk to encourage himself to quit smoking and remain smoke free.   Patient stated that do far not smoking has gone extraordinarily well. Patient stated that he has quit before and the longest has been approximately a month before relapsing. Patient expressed that he does not see himself going back to smoking this time. Patient shared that his wife and children are happy alongside himself. Patient is looking forward to seeing some improvements in his health because of quitting smoking.    Indicators of Success and Accountability:  Patient was able to implement all action steps leading up to him quitting smoking on February 12, 2022. Readiness: Patient is in the action phase of smoking cessation.  Strengths and Supports: Patient is being supported by his wife and children. Patient is relying on his ability to commit to his goal and follow through with determination.  Challenges and Barriers: Patient does not foresee any challenges/barriers to maintaining smoking cessation.    Coaching Outcomes: Patient will implement the following action steps as outlined below over the next month.    Overall Goal(s): Smoking Cessation - Quit Date: February 12, 2022    Agreement/Action Steps:  Continue to not smoke or use any tobacco products Use lozenges/Nicorette gum to deter smoking Start morning with a lozenge Use after eating meals Take lozenges/Nicorette gum with you when leaving home Implement positive self-talk to assist with remaining smoke free Play Solitaire after eating to  deter smoking     Attempted: Fulfilled - Patient completed the weekly agreement in full and was able to meet the challenge.

## 2022-02-27 ENCOUNTER — Ambulatory Visit (INDEPENDENT_AMBULATORY_CARE_PROVIDER_SITE_OTHER): Payer: Medicare Other | Admitting: Vascular Surgery

## 2022-02-27 ENCOUNTER — Encounter: Payer: Self-pay | Admitting: Vascular Surgery

## 2022-02-27 VITALS — BP 105/61 | HR 50 | Temp 98.4°F | Resp 20 | Ht 75.0 in | Wt 172.2 lb

## 2022-02-27 DIAGNOSIS — I7143 Infrarenal abdominal aortic aneurysm, without rupture: Secondary | ICD-10-CM

## 2022-02-27 DIAGNOSIS — J329 Chronic sinusitis, unspecified: Secondary | ICD-10-CM | POA: Insufficient documentation

## 2022-02-27 DIAGNOSIS — B9689 Other specified bacterial agents as the cause of diseases classified elsewhere: Secondary | ICD-10-CM

## 2022-02-27 HISTORY — DX: Other specified bacterial agents as the cause of diseases classified elsewhere: B96.89

## 2022-03-01 DIAGNOSIS — H40013 Open angle with borderline findings, low risk, bilateral: Secondary | ICD-10-CM | POA: Diagnosis not present

## 2022-03-14 ENCOUNTER — Ambulatory Visit: Payer: Medicare Other

## 2022-03-19 DIAGNOSIS — R051 Acute cough: Secondary | ICD-10-CM | POA: Diagnosis not present

## 2022-03-21 ENCOUNTER — Ambulatory Visit (INDEPENDENT_AMBULATORY_CARE_PROVIDER_SITE_OTHER): Payer: Medicare Other

## 2022-03-21 DIAGNOSIS — Z0000 Encounter for general adult medical examination without abnormal findings: Secondary | ICD-10-CM

## 2022-03-21 NOTE — Progress Notes (Signed)
Appointment Outcome: Completed, Session #: 54-monthf/u Start time: 10:01am   End time: 10:27am   Total Mins: 26 minutes   AGREEMENTS SECTION   Overall Goal(s): Smoking Cessation - Quit Date: February 12, 2022                                              Agreement/Action Steps:  Continue to not smoke or use any tobacco products Use lozenges/Nicorette gum to deter smoking Start morning with a lozenge Use after eating meals Take lozenges/Nicorette gum with you when leaving home Implement positive self-talk to assist with remaining smoke free Play Solitaire after eating to deter smoking   Progress Notes:  Patient has been able to remain smoke free over the past month without relapse. Patient stated that he does not think about smoking and typically do not have urges. Patient shared that he has been using lozenges if he does have an urge but have been able to reduce his use to 1-2 lozenges a day. Patient stated that he does not use the lozenges to get ahead of the craving but wait until he feels the need to use one. Patient shared that he does not experience urges after eating.   Patient stated that staying busy has been helpful also with keeping himself from thinking about smoking as well as needing to use a lozenge. Patient shared that his wife and himself take walks frequently. Patient engages in various activities to keep himself busy, and when he is not busy, he does not find it to be a challenge.   Patient expressed that he is proud of himself for being able to quit as well as his family. Patient mentioned that he was feeling better after quitting but has developed a head cold. Patient reported that it seems he has been getting these colds more after quitting smoking. Patient stated that he has been seen by a medical provider recently and will be following up with his PCP soon.   Indicators of Success and Accountability:  Patient has been able to maintain not smoking after quitting on June  12th.  Readiness: Patient is in the action phase of smoking cessation.  Strengths and Supports: Patient is being supported by his family. Patient continues to rely on his commitment to quit smoking and remain smoke free.  Challenges and Barriers: Patient does not foresee any challenges to maintaining not smoking.   Coaching Outcomes: Patient will implement his action steps over the next three months as outlined below.   Patient will strive to wean himself off lozenges to see if he is able to go without any nicotine from day to day.   Continue to not smoke or use any tobacco products Use lozenges/Nicorette gum to deter smoking when needed Start to wean self from using lozenges over the next three months Implement positive self-talk to assist with remaining smoke free Engage in various activities to deter cravings  Attempted: Fulfilled - Patient completed the weekly agreement in full and was able to meet the challenge.

## 2022-04-25 DIAGNOSIS — Z77122 Contact with and (suspected) exposure to noise: Secondary | ICD-10-CM | POA: Diagnosis not present

## 2022-04-25 DIAGNOSIS — H903 Sensorineural hearing loss, bilateral: Secondary | ICD-10-CM | POA: Diagnosis not present

## 2022-04-25 DIAGNOSIS — H6123 Impacted cerumen, bilateral: Secondary | ICD-10-CM | POA: Diagnosis not present

## 2022-06-04 ENCOUNTER — Ambulatory Visit
Admission: RE | Admit: 2022-06-04 | Discharge: 2022-06-04 | Disposition: A | Discharge status: Home / Self Care | Payer: Medicare Other | Source: Ambulatory Visit | Attending: Cardiology | Admitting: Cardiology

## 2022-06-04 DIAGNOSIS — I700 Atherosclerosis of aorta: Secondary | ICD-10-CM | POA: Diagnosis not present

## 2022-06-04 DIAGNOSIS — I77819 Aortic ectasia, unspecified site: Secondary | ICD-10-CM

## 2022-06-04 MED ORDER — IOPAMIDOL (ISOVUE-370) INJECTION 76%
75.0000 mL | Freq: Once | INTRAVENOUS | Status: AC | PRN
  Administered 2022-06-04: 75 mL via INTRAVENOUS

## 2022-06-05 DIAGNOSIS — I7143 Infrarenal abdominal aortic aneurysm, without rupture: Secondary | ICD-10-CM | POA: Diagnosis not present

## 2022-06-05 DIAGNOSIS — I700 Atherosclerosis of aorta: Secondary | ICD-10-CM | POA: Diagnosis not present

## 2022-06-05 DIAGNOSIS — Z0001 Encounter for general adult medical examination with abnormal findings: Secondary | ICD-10-CM | POA: Diagnosis not present

## 2022-06-05 DIAGNOSIS — F03A0 Unspecified dementia, mild, without behavioral disturbance, psychotic disturbance, mood disturbance, and anxiety: Secondary | ICD-10-CM | POA: Diagnosis not present

## 2022-06-05 DIAGNOSIS — E7800 Pure hypercholesterolemia, unspecified: Secondary | ICD-10-CM | POA: Diagnosis not present

## 2022-06-05 DIAGNOSIS — I701 Atherosclerosis of renal artery: Secondary | ICD-10-CM | POA: Diagnosis not present

## 2022-06-05 DIAGNOSIS — I723 Aneurysm of iliac artery: Secondary | ICD-10-CM | POA: Diagnosis not present

## 2022-06-05 DIAGNOSIS — Z79899 Other long term (current) drug therapy: Secondary | ICD-10-CM | POA: Diagnosis not present

## 2022-06-05 DIAGNOSIS — I2510 Atherosclerotic heart disease of native coronary artery without angina pectoris: Secondary | ICD-10-CM | POA: Diagnosis not present

## 2022-06-05 DIAGNOSIS — Z23 Encounter for immunization: Secondary | ICD-10-CM | POA: Diagnosis not present

## 2022-06-06 ENCOUNTER — Ambulatory Visit (INDEPENDENT_AMBULATORY_CARE_PROVIDER_SITE_OTHER): Payer: Medicare Other | Admitting: Physician Assistant

## 2022-06-06 ENCOUNTER — Encounter: Payer: Self-pay | Admitting: Physician Assistant

## 2022-06-06 VITALS — BP 107/73 | HR 57 | Resp 20 | Ht 75.0 in | Wt 177.0 lb

## 2022-06-06 DIAGNOSIS — I2510 Atherosclerotic heart disease of native coronary artery without angina pectoris: Secondary | ICD-10-CM | POA: Diagnosis not present

## 2022-06-06 DIAGNOSIS — G3184 Mild cognitive impairment, so stated: Secondary | ICD-10-CM

## 2022-06-06 MED ORDER — DONEPEZIL HCL 10 MG PO TABS: ORAL_TABLET | ORAL | 11 refills | Status: DC

## 2022-06-06 NOTE — Patient Instructions (Addendum)
It was a pleasure to see you today at our office.   Recommendations:  Follow up in  6 months Neuropsych testing for clarity of diagnosis and disease trajectory  Recommend good control of cardiovascular risk factors.    Recommend B12 replenishment daily, follow with PCP Continue donepezil 10 mg daily. Side effects were discussed   Whom to call:  Memory  decline, memory medications: Call our office [phone]   For psychiatric meds, mood meds: Please have your primary care physician manage these medications.   Counseling regarding caregiver distress, including caregiver depression, anxiety and issues regarding community resources, adult day care programs, adult living facilities, or memory care questions:   Feel free to contact [place], Social Worker at [phone]   For assessment of decision of mental capacity and competency:  Call Dr. Anthoney Harada, geriatric psychiatrist at [phone]  If you have any severe symptoms of a stroke, or other severe issues such as confusion,severe chills or fever, etc call 911 or go to the ER as you may need to be evaluated further     Feel free to go to the following database for funded clinical studies conducted around the world: http://saunders.com/   https://www.triadclinicaltrials.com/     RECOMMENDATIONS FOR ALL PATIENTS WITH MEMORY PROBLEMS: 1. Continue to exercise (Recommend 30 minutes of walking everyday, or 3 hours every week) 2. Increase social interactions - continue going to [place] and enjoy social gatherings with friends and family 3. Eat healthy, avoid fried foods and eat more fruits and vegetables 4. Maintain adequate blood pressure, blood sugar, and blood cholesterol level. Reducing the risk of stroke and cardiovascular disease also helps promoting better memory. 5. Avoid stressful situations. Live a simple life and avoid aggravations. Organize your time and prepare for the next day in anticipation. 6.  Sleep well, avoid any interruptions of sleep and avoid any distractions in the bedroom that may interfere with adequate sleep quality 7. Avoid sugar, avoid sweets as there is a strong link between excessive sugar intake, diabetes, and cognitive impairment We discussed the Mediterranean diet, which has been shown to help patients reduce the risk of progressive memory disorders and reduces cardiovascular risk. This includes eating fish, eat fruits and green leafy vegetables, nuts like almonds and hazelnuts, walnuts, and also use olive oil. Avoid fast foods and fried foods as much as possible. Avoid sweets and sugar as sugar use has been linked to worsening of memory function.  There is always a concern of gradual progression of memory problems. If this is the case, then we may need to adjust level of care according to patient needs. Support, both to the patient and caregiver, should then be put into place.    FALL PRECAUTIONS: Be cautious when walking. Scan the area for obstacles that may increase the risk of trips and falls. When getting up in the mornings, sit up at the edge of the bed for a few minutes before getting out of bed. Consider elevating the bed at the head end to avoid drop of blood pressure when getting up. Walk always in a well-lit room (use night lights in the walls). Avoid area rugs or power cords from appliances in the middle of the walkways. Use a walker or a cane if necessary and consider physical therapy for balance exercise. Get your eyesight checked regularly.  FINANCIAL OVERSIGHT: Supervision, especially oversight when making financial decisions or transactions is also recommended.  HOME SAFETY: Consider the safety of the kitchen when operating appliances like  stoves, microwave oven, and blender. Consider having supervision and share cooking responsibilities until no longer able to participate in those. Accidents with firearms and other hazards in the house should be identified and  addressed as well.   ABILITY TO BE LEFT ALONE: If patient is unable to contact 911 operator, consider using LifeLine, or when the need is there, arrange for someone to stay with patients. Smoking is a fire hazard, consider supervision or cessation. Risk of wandering should be assessed by caregiver and if detected at any point, supervision and safe proof recommendations should be instituted.  MEDICATION SUPERVISION: Inability to self-administer medication needs to be constantly addressed. Implement a mechanism to ensure safe administration of the medications.   DRIVING: Regarding driving, in patients with progressive memory problems, driving will be impaired. We advise to have someone else do the driving if trouble finding directions or if minor accidents are reported. Independent driving assessment is available to determine safety of driving.   If you are interested in the driving assessment, you can contact the following:  [employer] in [place]  [place] [phone]  [place]  [facility] [phone] or [phone]

## 2022-06-06 NOTE — Progress Notes (Addendum)
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 RH male with a history of sensorineural hearing loss, OSA not on CPAP, CAD, AAA, peripheral vascular disease on daily ASA, known lung nodule on surveillance,  a diagnosis of MCI by Neuropsych evaluation on 11/09/21, presenting today in follow-up for evaluation of memory loss. Patient is on donepezil 10 mg daily. MMSE today is 24/30    Recommendations:   Follow up in 6  months. Neuropsych testing for clarity of diagnosis and disease trajectory   Recommend good control of cardiovascular risk factors.  Continue baby aspirin daily Continue to control mood as per PCP Recommend discontinuing alcohol Repeat Neuropsych testing for clarity of diagnosis and disease trajectory in 6-12 months   Recommend B12 replenishment daily, follow with PCP Continue donepezil 10 mg daily. Side effects were discussed     Subjective:   This patient is  here alone. . Previous records as well as any outside records available were reviewed prior to todays visit.  He was last seen on 12/04/21.    Any changes in memory since last visit? "It is not any worse"  "I don't get challenged a lot". During his free time he likes to play solitaire on the computer, word finding and Jeopardy  repeats oneself?  Endorsed Disoriented when walking into a room?  Patient denies   Leaving objects in unusual places?  Patient denies   Ambulates  with difficulty?   Patient denies   Recent falls?  He rolled and fell off the bed fracturing a rib on the L, healing well. No LOC Any head injuries?  Patient denies   History of seizures?   Patient denies   Wandering behavior?  Patient denies   Patient drives?  Only on familiar roads, unless he uses GPS for that.  Any mood changes since last visit?  Denies . Patient denies but he may get frustrated when he cannot remember. Any depression?:  Patient denies    Hallucinations?  Patient denies   Paranoia?  Patient denies   Patient reports that sleeps well without vivid dreams, REM behavior or sleepwalking   History of sleep apnea?  Patient denies   Any hygiene concerns?  Patient denies   Independent of bathing and dressing?  Endorsed  Does the patient needs help with medications?  Denies. He uses a pillbox Who is in charge of the finances?  Patient is in charge - He is a Engineer, maintenance (IT)   Any changes in appetite?  Patient denies   Patient have trouble swallowing? Patient denies   Does the patient cook?  Patient denies   Any kitchen accidents such as leaving the stove on? Patient denies   Any headaches?  Patient denies   Double vision? Patient denies   Any focal numbness or tingling?  Patient denies   Chronic back pain Patient denies   Unilateral weakness?  Patient denies   Any tremors?  Patient denies   Any history of anosmia?  Patient denies   Any incontinence of urine?  Patient denies   Any bowel dysfunction?   Patient denies      Patient lives with wife  He drinks 2 beers a night He stopped smoking    Neurocognitive testing 11/2021 Briefly, results suggested severe impairment across essentially all aspects of learning and memory. Additional weaknesses were exhibited across complex attention and executive functioning, while performance variability was exhibited across processing speed and visuospatial abilities. The  etiology of ongoing dysfunction is unclear at the present time. Despite this, consideration should be given to the presence of Alzheimer's disease given ongoing memory impairment. Raymond Robinson did not benefit from repeated exposure to information across learning trials, was fully amnestic (i.e., 0% retention) across all three verbal memory tasks, and performed poorly across yes/no recognition trials. Despite yielding a 100% retention rate on a visual memory task, this was likely due to happenstance guessing given this test's forced choice response  format. Overall, memory testing suggests concerns for rapid forgetting and an evolving and already quite severe memory storage deficit, both of which are hallmark characteristics of Alzheimer's disease. There is also concern for a alcohol contribution to ongoing impairment.    Raymond Robinson was accompanied by his wife during the current feedback session. Content of the current session focused on the results of his neuropsychological evaluation. Raymond Robinson was given the opportunity to ask questions and his questions were answered. He was encouraged to reach out should additional questions arise. A copy of his report was provided at the conclusion of the visit.     Pertinent labs October 2022 B12 159, TSH 1.63  Past Medical History:  Diagnosis Date   Abdominal aortic aneurysm without rupture ([place]) 07/06/2020   Abscess, peritonsillar 12/28/2016   Acid reflux    Acute serous otitis media of left ear 01/11/2017   Acute tonsillitis 04/07/2019   Alcohol abuse    Amnestic MCI (mild cognitive impairment with memory loss) 11/09/2021   Aneurysm of iliac artery 07/06/2020   Asthma    Atherosclerosis of renal artery    Atherosclerotic heart disease of native coronary artery without angina pectoris    Cigarette nicotine dependence without complication 02/27/9484   ETD (Eustachian tube dysfunction), bilateral 01/11/2017   Hiatal hernia    Hyperlipidemia    Obstructive sleep apnea    no CPAP use   Personal history of colonic polyps 07/06/2020   Pilonidal cyst    Pure hypercholesterolemia    Renal artery stenosis 07/06/2020   Seasonal allergic rhinitis 01/11/2017   Sensorineural hearing loss (SNHL) of both ears 02/22/2017   Tinnitus, bilateral 02/22/2017     Past Surgical History:  Procedure Laterality Date   JOINT REPLACEMENT Right 2005   Knee   KNEE SURGERY     right     PREVIOUS MEDICATIONS:   CURRENT MEDICATIONS:  Outpatient Encounter Medications as of 06/06/2022  Medication Sig    acetaminophen (TYLENOL) 500 MG tablet Take 1,000 mg by mouth every 6 (six) hours as needed for mild pain, moderate pain, fever or headache.   alendronate (FOSAMAX) 70 MG tablet    aspirin EC 81 MG tablet Take 1 tablet (81 mg total) by mouth daily. Swallow whole.   atorvastatin (LIPITOR) 20 MG tablet Take 20 mg by mouth at bedtime.   clotrimazole-betamethasone (LOTRISONE) cream    fluticasone (FLONASE) 50 MCG/ACT nasal spray Place into the nose.   valACYclovir (VALTREX) 1000 MG tablet Take by mouth.   [DISCONTINUED] donepezil (ARICEPT) 10 MG tablet Take half tablet (5 mg) daily for 2 weeks, then increase to the full tablet at 10 mg daily   donepezil (ARICEPT) 10 MG tablet Take one tablet at 10 mg nightly   No facility-administered encounter medications on file as of 06/06/2022.     Objective:     PHYSICAL EXAMINATION:    VITALS:   Vitals:   06/06/22 0912  BP: 107/73  Pulse: (!) 57  Resp: 20  SpO2: 98%  Weight: 177 lb (80.3 kg)  Height: '6\' 3"'$  (1.905 m)    GEN:  The patient appears stated age and is in NAD. HEENT:  Normocephalic, atraumatic.   Neurological examination:  General: NAD, well-groomed, appears stated age. Orientation: The patient is alert. Oriented to person, place and date Cranial nerves: There is good facial symmetry.The speech is fluent and clear. No aphasia or dysarthria. Fund of knowledge is appropriate. Recent memory impaired and remote memory is normal.  Attention and concentration are normal.  Able to name objects and repeat phrases.  Hearing is intact to conversational tone.    Sensation: Sensation is intact to light touch throughout Motor: Strength is at least antigravity x4. Tremors: none  DTR's 2/4 in UE/LE      06/05/2021   12:00 PM  Montreal Cognitive Assessment   Visuospatial/ Executive (0/5) 1  Naming (0/3) 3  Attention: Read list of digits (0/2) 2  Attention: Read list of letters (0/1) 1  Attention: Serial 7 subtraction starting at 100 (0/3)  3  Language: Repeat phrase (0/2) 2  Language : Fluency (0/1) 1  Abstraction (0/2) 2  Delayed Recall (0/5) 0  Orientation (0/6) 5  Total 20  Adjusted Score (based on education) 20       06/06/2022    9:00 AM  MMSE - Mini Mental State Exam  Orientation to time 5  Orientation to Place 5  Registration 0  Attention/ Calculation 5  Recall 3  Language- name 2 objects 2  Language- repeat 1  Language- follow 3 step command 1  Language- read & follow direction 1  Write a sentence 1  Copy design 0  Total score 24       Movement examination: Tone: There is normal tone in the UE/LE Abnormal movements:  no tremor.  No myoclonus.  No asterixis.   Coordination:  There is no decremation with RAM's. Normal finger to nose  Gait and Station: The patient has no difficulty arising out of a deep-seated chair without the use of the hands. The patient's stride length is good.  Gait is cautious and narrow.   Thank you for allowing [place] the opportunity to participate in the care of this nice patient. Please do not hesitate to contact us for any questions or concerns.   Total time spent on today's visit was 30 minutes dedicated to this patient today, preparing to see patient, examining the patient, ordering tests and/or medications and counseling the patient, documenting clinical information in the EHR or other health record, independently interpreting results and communicating results to the patient/family, discussing treatment and goals, answering patient's questions and coordinating care.  Cc:  Lujean Amel, MD  Sharene Butters 06/06/2022 12:29 PM

## 2022-06-13 DIAGNOSIS — Z23 Encounter for immunization: Secondary | ICD-10-CM | POA: Diagnosis not present

## 2022-06-21 ENCOUNTER — Ambulatory Visit: Payer: Medicare Other | Attending: Cardiology

## 2022-06-21 DIAGNOSIS — Z0000 Encounter for general adult medical examination without abnormal findings: Secondary | ICD-10-CM

## 2022-06-21 NOTE — Progress Notes (Signed)
Appointment Outcome: Completed, Session #: 19-monthf/u Start time: 9:54am   End time: 10:16am   Total Mins: 22 minutes  AGREEMENTS SECTION   Overall Goal(s): Smoking Cessation - Quit Date: February 12, 2022                                              Agreement/Action Steps:  Continue to not smoke or use any tobacco products Use lozenges/Nicorette gum to deter smoking when needed Start to wean self from using lozenges over the next three months Implement positive self-talk to assist with remaining smoke free Engage in various activities to deter cravings   Progress Notes:  Patient stated that he has not smoked over the past three months and have worked himself off the lozenges 2 weeks ago. Patient reported that he started with the 4 mg lozenges and went down to '2mg'$  before not using them recently. Patient shared that has not been around others who smoke and is not tempted to smoke. Patient mentioned that he hasn't had any challenges recently with urges after eating or when he is driving. Patient explained that he stays busy with yard work at his home and his daughter's home, going on walks with his wife, and [place].   Patient shared that he has hurt his back from falling due to a muscle cramp in his hamstring. Patient reported that he broke a rib and was in pain but refrained from smoking. Patent stated that he doesn't think or dream about smoking. Patient expressed that he feels good about his progress. Patient expressed that he wants to continue to go without using the lozenges but [place]   Patient expressed that he has been able to continue to not smoke because he realized that smoking wasn't good for him and with him being older it's worse. Patient looks at smoking as a dirty addiction and is not willing to go back to smoking. Patient shared that he has continued to be  motivated to not smoke while driving because he purchased a new car and refuses to smoke in it.    Indicators of Success and Accountability:  Patient has been able to maintain not smoking over the past three months and weaned himself off the lozenges two weeks ago.  Readiness: Patient is in the action stage of nicotine cessation. Strengths and Supports: Patient is being supported by his family. Patient contributes his success to  Challenges and Barriers: Patient does not foresee any challenges with implementing his action steps over the next 6 months.   Coaching Outcomes: Patient plans to take lozenges with him on his cruise because of the smoking culture he will be emersed in. Patient stated that in case of being around smoking becomes a temptation, he will use the lozenges until he returns from his trip, but the goal is not to have to use any since he isn't currently.    Patient will implement action steps as outlined below over the next two weeks.  Agreement/Action Steps:  Continue to not smoke or use any tobacco products Use lozenges/Nicorette gum to deter smoking when needed Resume not using [place] with remaining smoke free Engage in various activities to deter cravings  Attempted: Fulfilled - Patient completed the weekly agreement in full over the past three months and was able to meet the challenge.

## 2022-06-27 ENCOUNTER — Telehealth: Payer: Self-pay | Admitting: Cardiology

## 2022-06-27 NOTE — Telephone Encounter (Signed)
Patient given CT results per Dr. Gardiner Rhyme: "No aortic aneurysm.  Possible acute rib fracture-did he have recent injury? Recommend PCP follow-up." Patient stated he had been working on a deck for 2 days and one night, he had "the worse cramp ever" in his rt thigh and jumped out of bed, falling onto his left side. His PCP is aware of the rib fracture and pain has decreased.

## 2022-06-27 NOTE — Telephone Encounter (Signed)
Patient returned call for CT results.

## 2022-07-18 DIAGNOSIS — L0231 Cutaneous abscess of buttock: Secondary | ICD-10-CM | POA: Diagnosis not present

## 2022-08-08 DIAGNOSIS — D2262 Melanocytic nevi of left upper limb, including shoulder: Secondary | ICD-10-CM | POA: Diagnosis not present

## 2022-08-08 DIAGNOSIS — I788 Other diseases of capillaries: Secondary | ICD-10-CM | POA: Diagnosis not present

## 2022-08-08 DIAGNOSIS — L723 Sebaceous cyst: Secondary | ICD-10-CM | POA: Diagnosis not present

## 2022-08-08 DIAGNOSIS — L853 Xerosis cutis: Secondary | ICD-10-CM | POA: Diagnosis not present

## 2022-08-08 DIAGNOSIS — L821 Other seborrheic keratosis: Secondary | ICD-10-CM | POA: Diagnosis not present

## 2022-08-17 ENCOUNTER — Ambulatory Visit
Admission: RE | Admit: 2022-08-17 | Discharge: 2022-08-17 | Disposition: A | Discharge status: Home / Self Care | Payer: Medicare Other | Source: Ambulatory Visit | Attending: Acute Care | Admitting: Acute Care

## 2022-08-17 DIAGNOSIS — Z122 Encounter for screening for malignant neoplasm of respiratory organs: Secondary | ICD-10-CM

## 2022-08-17 DIAGNOSIS — F17210 Nicotine dependence, cigarettes, uncomplicated: Secondary | ICD-10-CM

## 2022-08-17 DIAGNOSIS — Z87891 Personal history of nicotine dependence: Secondary | ICD-10-CM

## 2022-08-17 DIAGNOSIS — R911 Solitary pulmonary nodule: Secondary | ICD-10-CM

## 2022-08-22 ENCOUNTER — Telehealth: Payer: Self-pay | Admitting: Acute Care

## 2022-08-22 NOTE — Telephone Encounter (Signed)
I have called the patient with the results of his low-dose screening CT.  I explained that the nodule that was of concern in his previous scans is continuing to shrink. Plan is for 77-monthfollow-up low-dose screening CT. Additionally notation of aortic aneurysm is being followed by patient's thoracic surgeon Notation of aortic atherosclerosis is being followed by his cardiologist DLangley Gauss158-monthollow-up low-dose screening CT due in December 2024 Please fax results to PCP. Thank so much

## 2022-08-23 ENCOUNTER — Other Ambulatory Visit: Payer: Self-pay

## 2022-08-23 DIAGNOSIS — F17210 Nicotine dependence, cigarettes, uncomplicated: Secondary | ICD-10-CM

## 2022-08-23 DIAGNOSIS — Z87891 Personal history of nicotine dependence: Secondary | ICD-10-CM

## 2022-08-23 NOTE — Telephone Encounter (Signed)
Results faxed to PCP and new order for annual LDCT 2024 placed

## 2022-09-28 ENCOUNTER — Ambulatory Visit
Admission: RE | Admit: 2022-09-28 | Discharge: 2022-09-28 | Disposition: A | Discharge status: Home / Self Care | Payer: Medicare Other | Source: Ambulatory Visit | Attending: Family Medicine | Admitting: Family Medicine

## 2022-09-28 ENCOUNTER — Other Ambulatory Visit: Payer: Self-pay | Admitting: Family Medicine

## 2022-09-28 DIAGNOSIS — R051 Acute cough: Secondary | ICD-10-CM | POA: Diagnosis not present

## 2022-09-28 DIAGNOSIS — H6691 Otitis media, unspecified, right ear: Secondary | ICD-10-CM | POA: Diagnosis not present

## 2022-09-28 DIAGNOSIS — J40 Bronchitis, not specified as acute or chronic: Secondary | ICD-10-CM | POA: Diagnosis not present

## 2022-09-28 DIAGNOSIS — L089 Local infection of the skin and subcutaneous tissue, unspecified: Secondary | ICD-10-CM | POA: Diagnosis not present

## 2022-09-28 DIAGNOSIS — Z03818 Encounter for observation for suspected exposure to other biological agents ruled out: Secondary | ICD-10-CM | POA: Diagnosis not present

## 2022-09-28 DIAGNOSIS — R0602 Shortness of breath: Secondary | ICD-10-CM | POA: Diagnosis not present

## 2022-10-11 DIAGNOSIS — H6121 Impacted cerumen, right ear: Secondary | ICD-10-CM | POA: Diagnosis not present

## 2022-10-11 DIAGNOSIS — R9389 Abnormal findings on diagnostic imaging of other specified body structures: Secondary | ICD-10-CM | POA: Diagnosis not present

## 2022-10-11 DIAGNOSIS — H9201 Otalgia, right ear: Secondary | ICD-10-CM | POA: Diagnosis not present

## 2022-10-15 DIAGNOSIS — Z8669 Personal history of other diseases of the nervous system and sense organs: Secondary | ICD-10-CM | POA: Diagnosis not present

## 2022-10-15 DIAGNOSIS — H9221 Otorrhagia, right ear: Secondary | ICD-10-CM | POA: Diagnosis not present

## 2022-10-15 DIAGNOSIS — H938X3 Other specified disorders of ear, bilateral: Secondary | ICD-10-CM | POA: Diagnosis not present

## 2022-10-31 ENCOUNTER — Encounter: Payer: Self-pay | Admitting: Physician Assistant

## 2022-11-06 ENCOUNTER — Other Ambulatory Visit: Payer: Self-pay | Admitting: Family Medicine

## 2022-11-06 ENCOUNTER — Ambulatory Visit
Admission: RE | Admit: 2022-11-06 | Discharge: 2022-11-06 | Disposition: A | Discharge status: Home / Self Care | Payer: Medicare Other | Source: Ambulatory Visit | Attending: Family Medicine | Admitting: Family Medicine

## 2022-11-06 DIAGNOSIS — R918 Other nonspecific abnormal finding of lung field: Secondary | ICD-10-CM | POA: Diagnosis not present

## 2022-11-06 DIAGNOSIS — Z8701 Personal history of pneumonia (recurrent): Secondary | ICD-10-CM | POA: Diagnosis not present

## 2022-11-06 DIAGNOSIS — J439 Emphysema, unspecified: Secondary | ICD-10-CM | POA: Diagnosis not present

## 2022-11-06 DIAGNOSIS — S22080A Wedge compression fracture of T11-T12 vertebra, initial encounter for closed fracture: Secondary | ICD-10-CM | POA: Diagnosis not present

## 2022-11-06 DIAGNOSIS — R9389 Abnormal findings on diagnostic imaging of other specified body structures: Secondary | ICD-10-CM

## 2022-11-14 DIAGNOSIS — H903 Sensorineural hearing loss, bilateral: Secondary | ICD-10-CM | POA: Diagnosis not present

## 2022-11-14 DIAGNOSIS — H6993 Unspecified Eustachian tube disorder, bilateral: Secondary | ICD-10-CM | POA: Diagnosis not present

## 2022-11-14 DIAGNOSIS — H938X2 Other specified disorders of left ear: Secondary | ICD-10-CM | POA: Diagnosis not present

## 2022-11-15 DIAGNOSIS — D124 Benign neoplasm of descending colon: Secondary | ICD-10-CM | POA: Diagnosis not present

## 2022-11-15 DIAGNOSIS — Z86010 Personal history of colonic polyps: Secondary | ICD-10-CM | POA: Diagnosis not present

## 2022-11-15 DIAGNOSIS — D123 Benign neoplasm of transverse colon: Secondary | ICD-10-CM | POA: Diagnosis not present

## 2022-11-15 DIAGNOSIS — K648 Other hemorrhoids: Secondary | ICD-10-CM | POA: Diagnosis not present

## 2022-11-15 DIAGNOSIS — Z09 Encounter for follow-up examination after completed treatment for conditions other than malignant neoplasm: Secondary | ICD-10-CM | POA: Diagnosis not present

## 2022-11-15 DIAGNOSIS — D120 Benign neoplasm of cecum: Secondary | ICD-10-CM | POA: Diagnosis not present

## 2022-11-15 DIAGNOSIS — K5730 Diverticulosis of large intestine without perforation or abscess without bleeding: Secondary | ICD-10-CM | POA: Diagnosis not present

## 2022-11-15 DIAGNOSIS — D122 Benign neoplasm of ascending colon: Secondary | ICD-10-CM | POA: Diagnosis not present

## 2022-11-16 DIAGNOSIS — Z125 Encounter for screening for malignant neoplasm of prostate: Secondary | ICD-10-CM | POA: Diagnosis not present

## 2022-11-19 DIAGNOSIS — D120 Benign neoplasm of cecum: Secondary | ICD-10-CM | POA: Diagnosis not present

## 2022-11-19 DIAGNOSIS — D123 Benign neoplasm of transverse colon: Secondary | ICD-10-CM | POA: Diagnosis not present

## 2022-11-19 DIAGNOSIS — D122 Benign neoplasm of ascending colon: Secondary | ICD-10-CM | POA: Diagnosis not present

## 2022-11-19 DIAGNOSIS — D124 Benign neoplasm of descending colon: Secondary | ICD-10-CM | POA: Diagnosis not present

## 2022-11-22 DIAGNOSIS — R972 Elevated prostate specific antigen [PSA]: Secondary | ICD-10-CM | POA: Diagnosis not present

## 2022-11-22 DIAGNOSIS — N400 Enlarged prostate without lower urinary tract symptoms: Secondary | ICD-10-CM | POA: Diagnosis not present

## 2022-11-23 ENCOUNTER — Other Ambulatory Visit: Payer: Self-pay | Admitting: Urology

## 2022-11-23 DIAGNOSIS — R972 Elevated prostate specific antigen [PSA]: Secondary | ICD-10-CM

## 2022-11-26 ENCOUNTER — Ambulatory Visit: Payer: Medicare Other | Admitting: Physician Assistant

## 2022-11-28 ENCOUNTER — Ambulatory Visit (INDEPENDENT_AMBULATORY_CARE_PROVIDER_SITE_OTHER): Payer: Medicare Other | Admitting: Physician Assistant

## 2022-11-28 ENCOUNTER — Encounter: Payer: Self-pay | Admitting: Physician Assistant

## 2022-11-28 VITALS — BP 121/71 | HR 51 | Resp 18 | Ht 75.0 in | Wt 193.0 lb

## 2022-11-28 DIAGNOSIS — G3184 Mild cognitive impairment, so stated: Secondary | ICD-10-CM

## 2022-11-28 MED ORDER — MEMANTINE HCL 5 MG PO TABS: ORAL_TABLET | ORAL | 11 refills | Status: DC

## 2022-11-28 NOTE — Progress Notes (Signed)
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 RH male bilateral hearing loss with recent bilateral tympanic membrane rupture after airplane travel on March 2024 requiring ofloxacin eardrops with improved hearing, followed at [facility], OSA not on CPAP, CAD, AAA, PVD on daily acid, history of known lung nodule on surveillance,  and a history of amnestic MCI per Neuropsych evaluation March 2023, presenting today in follow-up for evaluation of memory loss. Patient is on donepezil 10 mg daily tolerating well. Memory is stable, however, he is bradycardic,, and this is likely medication induced. We discussed discontinuing donepezil and starting memantine. He agrees with the plan. He remains active and independent of his ADLs.     Recommendations:   Follow up in  6  months. Patient is scheduled for repeat Neuropsych on May 2024 testing for clarity of diagnosis and disease trajectory Recommended control of cardiovascular risk factors. Consider Cards follow up for bradycardia (rate bet 49-51) Continue to control mood as per PCP Discontinue donepezil due to bradycardia  Start Memantine 5mg  tablets twice daily.   Side effects discussed Recommend B12 replenishment daily and follow-up with PCP    Subjective:   This patient is accompanied in the office by his wife  who supplements the history. Previous records as well as any outside records available were reviewed prior to todays visit.   Patient was last seen on 06/06/2022 at which time his MMSE was 24/30     Any changes in memory since last visit? He denies any changes. He is able to recall conversations and names. He enjoys playing solitaire on the computer, word finding and Jeopardy.  repeats oneself?  Endorsed Disoriented when walking into a room?  Patient denies   Leaving objects in unusual places?  Patient denies . "He tries to be methodical "-wife says   Wandering behavior?   denies   Any personality changes since last visit? He is doing better, "the anxiety is minimal. He is much more relaxed." Sometimes he may experience some frustration when he cannot remember but this is not frequent. Any worsening depression?: denies   Hallucinations or paranoia?  denies   Seizures?   denies    Any sleep changes?  He sleeps well, although his wife says he does not get a solid sleep . He denies vivid dreams, REM behavior or sleepwalking   Sleep apnea?   Endorsed, on CPAP. Any hygiene concerns?   denies   Independent of bathing and dressing?  Endorsed  Does the patient needs help with medications?  Patient is in charge, uses a pillbox  Who is in charge of the finances?  Wife is in charge, he used a CPAP    Any changes in appetite?  Gained weight, he is eating well.     Patient have trouble swallowing?  denies   Does the patient cook?  Any kitchen accidents such as leaving the stove on?   denies   Any headaches?    denies   Vision changes? denies Chronic back pain  denies   Ambulates with difficulty?    denies   Recent falls or head injuries?    denies     Unilateral weakness, numbness or tingling?   denies   Any tremors?  denies   Any anosmia?    denies   Any incontinence of urine?  denies   Any bowel dysfunction?  denies      Patient lives  with his wife Alcohol?  He drinks on occasion   Does the patient drive?  "Only on familiar roads unless using a GPS "     Neurocognitive testing 11/2021 Briefly, results suggested severe impairment across essentially all aspects of learning and memory. Additional weaknesses were exhibited across complex attention and executive functioning, while performance variability was exhibited across processing speed and visuospatial abilities. The etiology of ongoing dysfunction is unclear at the present time. Despite this, consideration should be given to the presence of Alzheimer's disease given ongoing memory impairment.  Raymond Robinson did not benefit from repeated exposure to information across learning trials, was fully amnestic (i.e., 0% retention) across all three verbal memory tasks, and performed poorly across yes/no recognition trials. Despite yielding a 100% retention rate on a visual memory task, this was likely due to happenstance guessing given this test's forced choice response format. Overall, memory testing suggests concerns for rapid forgetting and an evolving and already quite severe memory storage deficit, both of which are hallmark characteristics of Alzheimer's disease. There is also concern for a alcohol contribution to ongoing impairment.    Raymond Robinson was accompanied by his wife during the current feedback session. Content of the current session focused on the results of his neuropsychological evaluation. Raymond Robinson was given the opportunity to ask questions and his questions were answered. He was encouraged to reach out should additional questions arise. A copy of his report was provided at the conclusion of the visit.      Pertinent labs October 2022 B12 159, TSH 1.63  Past Medical History:  Diagnosis Date   Abdominal aortic aneurysm without rupture ([place]) 07/06/2020   Abscess, peritonsillar 12/28/2016   Acid reflux    Acute serous otitis media of left ear 01/11/2017   Acute tonsillitis 04/07/2019   Alcohol abuse    Amnestic MCI (mild cognitive impairment with memory loss) 11/09/2021   Aneurysm of iliac artery 07/06/2020   Asthma    Atherosclerosis of renal artery    Atherosclerotic heart disease of native coronary artery without angina pectoris    Cigarette nicotine dependence without complication 0000000   ETD (Eustachian tube dysfunction), bilateral 01/11/2017   Hiatal hernia    Hyperlipidemia    Obstructive sleep apnea    no CPAP use   Personal history of colonic polyps 07/06/2020   Pilonidal cyst    Pure hypercholesterolemia    Renal artery stenosis 07/06/2020   Seasonal allergic  rhinitis 01/11/2017   Sensorineural hearing loss (SNHL) of both ears 02/22/2017   Tinnitus, bilateral 02/22/2017     Past Surgical History:  Procedure Laterality Date   JOINT REPLACEMENT Right 2005   Knee   KNEE SURGERY     right     PREVIOUS MEDICATIONS:   CURRENT MEDICATIONS:  Outpatient Encounter Medications as of 11/28/2022  Medication Sig   acetaminophen (TYLENOL) 500 MG tablet Take 1,000 mg by mouth every 6 (six) hours as needed for mild pain, moderate pain, fever or headache.   alendronate (FOSAMAX) 70 MG tablet    aspirin EC 81 MG tablet Take 1 tablet (81 mg total) by mouth daily. Swallow whole.   atorvastatin (LIPITOR) 20 MG tablet Take 20 mg by mouth at bedtime.   clotrimazole-betamethasone (LOTRISONE) cream    donepezil (ARICEPT) 10 MG tablet Take one tablet at 10 mg nightly   fluticasone (FLONASE) 50 MCG/ACT nasal spray Place into the nose.   memantine (NAMENDA) 5 MG tablet Take 1 tablet (5 mg at night) for 2  weeks, then increase to 1 tablet (5 mg) twice a day   valACYclovir (VALTREX) 1000 MG tablet Take by mouth.   No facility-administered encounter medications on file as of 11/28/2022.     Objective:     PHYSICAL EXAMINATION:    VITALS:   Vitals:   11/28/22 1443 11/28/22 1528  BP: 121/71   Pulse: (!) 49 (!) 51  Resp: 18   SpO2: 96%   Weight: 193 lb (87.5 kg)   Height: 6\' 3"  (1.905 m)     GEN:  The patient appears stated age and is in NAD. HEENT:  Normocephalic, atraumatic.   Neurological examination:  General: NAD, well-groomed, appears stated age. Orientation: The patient is alert. Oriented to person, place and date Cranial nerves: There is good facial symmetry.The speech is fluent and clear. No aphasia or dysarthria. Fund of knowledge is appropriate. Recent memory impaired and remote memory is normal.  Attention and concentration are normal.  Able to name objects and repeat phrases.  Hearing is intact to conversational tone .  Sensation: Sensation  is intact to light touch throughout Motor: Strength is at least antigravity x4. Tremors: none  DTR's 2/4 in UE/LE      06/05/2021   12:00 PM  Montreal Cognitive Assessment   Visuospatial/ Executive (0/5) 1  Naming (0/3) 3  Attention: Read list of digits (0/2) 2  Attention: Read list of letters (0/1) 1  Attention: Serial 7 subtraction starting at 100 (0/3) 3  Language: Repeat phrase (0/2) 2  Language : Fluency (0/1) 1  Abstraction (0/2) 2  Delayed Recall (0/5) 0  Orientation (0/6) 5  Total 20  Adjusted Score (based on education) 20       06/06/2022    9:00 AM  MMSE - Mini Mental State Exam  Orientation to time 5  Orientation to Place 5  Registration 0  Attention/ Calculation 5  Recall 3  Language- name 2 objects 2  Language- repeat 1  Language- follow 3 step command 1  Language- read & follow direction 1  Write a sentence 1  Copy design 0  Total score 24       Movement examination: Tone: There is normal tone in the UE/LE Abnormal movements:  no tremor.  No myoclonus.  No asterixis.   Coordination:  There is no decremation with RAM's. Normal finger to nose  Gait and Station: The patient has no difficulty arising out of a deep-seated chair without the use of the hands. The patient's stride length is good.  Gait is cautious and narrow.   Thank you for allowing [place] the opportunity to participate in the care of this nice patient. Please do not hesitate to contact us for any questions or concerns.   Total time spent on today's visit was 33 minutes dedicated to this patient today, preparing to see patient, examining the patient, ordering tests and/or medications and counseling the patient, documenting clinical information in the EHR or other health record, independently interpreting results and communicating results to the patient/family, discussing treatment and goals, answering patient's questions and coordinating care.  Cc:  Lujean Amel, MD  Sharene Butters 11/28/2022  5:28 PM

## 2022-11-28 NOTE — Patient Instructions (Signed)
It was a pleasure to see you today at our office.   Recommendations:  Follow up in  6 months Neuropsych testing for clarity of diagnosis and disease trajectory  Recommend good control of cardiovascular risk factors.    Recommend B12 replenishment daily, follow with PCP Stop donepezil 10 mg daily. Start Memantine 5mg  tablets.  Take 1 tablet at bedtime for 2 weeks, then 1 tablet twice daily.     Check with Cardiology about your history of low heart rate   Whom to call:  Memory  decline, memory medications: Call our office [phone]   For psychiatric meds, mood meds: Please have your primary care physician manage these medications.   Counseling regarding caregiver distress, including caregiver depression, anxiety and issues regarding community resources, adult day care programs, adult living facilities, or memory care questions:   Feel free to contact [place], Social Worker at [phone]   For assessment of decision of mental capacity and competency:  Call Dr. Anthoney Harada, geriatric psychiatrist at [phone]  If you have any severe symptoms of a stroke, or other severe issues such as confusion,severe chills or fever, etc call 911 or go to the ER as you may need to be evaluated further     Feel free to go to the following database for funded clinical studies conducted around the world: http://saunders.com/   https://www.triadclinicaltrials.com/     RECOMMENDATIONS FOR ALL PATIENTS WITH MEMORY PROBLEMS: 1. Continue to exercise (Recommend 30 minutes of walking everyday, or 3 hours every week) 2. Increase social interactions - continue going to [place] and enjoy social gatherings with friends and family 3. Eat healthy, avoid fried foods and eat more fruits and vegetables 4. Maintain adequate blood pressure, blood sugar, and blood cholesterol level. Reducing the risk of stroke and cardiovascular disease also helps promoting better memory. 5. Avoid  stressful situations. Live a simple life and avoid aggravations. Organize your time and prepare for the next day in anticipation. 6. Sleep well, avoid any interruptions of sleep and avoid any distractions in the bedroom that may interfere with adequate sleep quality 7. Avoid sugar, avoid sweets as there is a strong link between excessive sugar intake, diabetes, and cognitive impairment We discussed the Mediterranean diet, which has been shown to help patients reduce the risk of progressive memory disorders and reduces cardiovascular risk. This includes eating fish, eat fruits and green leafy vegetables, nuts like almonds and hazelnuts, walnuts, and also use olive oil. Avoid fast foods and fried foods as much as possible. Avoid sweets and sugar as sugar use has been linked to worsening of memory function.  There is always a concern of gradual progression of memory problems. If this is the case, then we may need to adjust level of care according to patient needs. Support, both to the patient and caregiver, should then be put into place.    FALL PRECAUTIONS: Be cautious when walking. Scan the area for obstacles that may increase the risk of trips and falls. When getting up in the mornings, sit up at the edge of the bed for a few minutes before getting out of bed. Consider elevating the bed at the head end to avoid drop of blood pressure when getting up. Walk always in a well-lit room (use night lights in the walls). Avoid area rugs or power cords from appliances in the middle of the walkways. Use a walker or a cane if necessary and consider physical therapy for balance exercise. Get your eyesight checked regularly.  FINANCIAL OVERSIGHT: Supervision, especially oversight when making financial decisions or transactions is also recommended.  HOME SAFETY: Consider the safety of the kitchen when operating appliances like stoves, microwave oven, and blender. Consider having supervision and share cooking  responsibilities until no longer able to participate in those. Accidents with firearms and other hazards in the house should be identified and addressed as well.   ABILITY TO BE LEFT ALONE: If patient is unable to contact 911 operator, consider using LifeLine, or when the need is there, arrange for someone to stay with patients. Smoking is a fire hazard, consider supervision or cessation. Risk of wandering should be assessed by caregiver and if detected at any point, supervision and safe proof recommendations should be instituted.  MEDICATION SUPERVISION: Inability to self-administer medication needs to be constantly addressed. Implement a mechanism to ensure safe administration of the medications.   DRIVING: Regarding driving, in patients with progressive memory problems, driving will be impaired. We advise to have someone else do the driving if trouble finding directions or if minor accidents are reported. Independent driving assessment is available to determine safety of driving.   If you are interested in the driving assessment, you can contact the following:  [employer] in [place]  [place] [phone]  [place]  [facility] [phone] or [phone]

## 2022-12-06 ENCOUNTER — Ambulatory Visit: Payer: Medicare Other | Admitting: Physician Assistant

## 2022-12-18 ENCOUNTER — Ambulatory Visit
Admission: RE | Admit: 2022-12-18 | Discharge: 2022-12-18 | Disposition: A | Discharge status: Home / Self Care | Payer: Medicare Other | Source: Ambulatory Visit | Attending: Urology | Admitting: Urology

## 2022-12-18 DIAGNOSIS — R972 Elevated prostate specific antigen [PSA]: Secondary | ICD-10-CM

## 2022-12-18 MED ORDER — GADOPICLENOL 0.5 MMOL/ML IV SOLN
9.0000 mL | Freq: Once | INTRAVENOUS | Status: AC | PRN
  Administered 2022-12-18: 9 mL via INTRAVENOUS

## 2022-12-21 ENCOUNTER — Ambulatory Visit: Payer: Medicare Other | Attending: Cardiology

## 2022-12-21 DIAGNOSIS — Z0000 Encounter for general adult medical examination without abnormal findings: Secondary | ICD-10-CM

## 2022-12-21 NOTE — Progress Notes (Signed)
Appointment Outcome: Completed, Session #: 79-month f/u (final) Start time: 9:57am   End time: 10:17am   Total Mins:  20 minutes  AGREEMENTS SECTION   Overall Goal(s): Smoking Cessation - Quit Date: February 12, 2022                                               Agreement/Action Steps:  Continue to not smoke or use any tobacco products Use lozenges/Nicorette gum to deter smoking when needed Start to wean self from using lozenges over the next three months Implement positive self-talk to assist with remaining smoke free Engage in various activities to deter cravings    Progress Notes:  Patient reported that he has been able to maintain not smoking over the past 6 months and that he is proud of himself. Patient shared that he has stopped using lozenges as well. Patient mentioned that he recognized that they cost more than cigarettes and decided he didn't need to continue using them.   Patient stated that he doesn't have cravings or think about smoking. Patient shared that he hasn't been around anyone who smokes. Patient expressed that he reminded himself that he doesn't need or want a cigarette and think about his progress made over the past 10 months.   Patient explained that his congestion has cleared up and it's easier to breathe over time after quitting smoking. Patient stated that he stays busy by working in the yard and going for walks with his wife at times.   Patient expressed that he was able to be successful with health coaching because the co-created action plan was feasible to implement and easy to sustain. Patient stated that at first, he thought smoking cessation was going to be difficult for him.    Indicators of Success and Accountability: Patient has been able to refrain from smoking over the past 10 minutes and discontinued use of lozenges.   Readiness: Patient is in the maintenance phase of smoking cessation.   Strengths and Supports: Patient is being supported by his  wife. Patient is relying on his dedication to improving his health and ability to sustain healthy behaviors by reflecting.   Challenges and Barriers: Patient does not foresee any challenges with maintaining not using tobacco or nicotine products.    Coaching Outcomes: Patient has successfully completed the program and has been discharged.  Patient was able to maintain not using any tobacco products and stopped nicotine lozenges as well over the past 6 months and will continue to implement his action steps to remain nicotine/smoke free.    Attempted: Fulfilled - patient completed his 17-month agreement in full and was able to meet the challenge successfully.

## 2022-12-25 NOTE — Progress Notes (Unsigned)
Cardiology Office Note:    Date:  12/26/2022   ID:  Raymond Robinson, DOB Dec 12, 1955, MRN [id number]  PCP:  Darrow Bussing, MD [facility] Cardiologist: Little Ishikawa, MD   Reason for visit: 1 year follow-up  History of Present Illness:    Raymond Robinson is a 67 y.o. male with a hx of abdominal aortic aneurysm (followed by vascular surgery), hyperlipidemia, OSA, asthma, tobacco use.  He was referred by Dr. Docia Chuck for evaluation of coronary calcification seen on CT, initially seen on 04/27/2021.  Calcium score 971 on 06/09/2021 (89th percentile). CTA chest aorta 06/2022 without thoracic aortic aneurysm.  He last saw Dr. Bjorn Pippin in March 2023.  He was doing well without chest pain or shortness of breath.  He was exercising with Pilates, yard work and walking.  Smoking cessation encouraged.  Today, patient comes in with his wife.  They state after meeting with Renaee Munda he quit smoking last June.  They state that there was concern from neurology -Durenda Age, PA of bradycardia after starting Aricept for memory loss.  He was changed him to [place].  Heart rate has been as low as 46 per his report.  Heart rate today 51 on EKG - showing sinus bradycardia.  Patient denies lightheadedness/syncope.  Otherwise he denies chest pain, shortness of breath, orthopnea, lower extremity edema and palpitations.  He states that he has a history of sleep apnea which was lessened with weight loss.  It has been 15 years since his last evaluation.  He has poor sleep and wakes up fatigued.    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   Aneurysm of iliac artery 07/06/2020   Asthma    Atherosclerosis of renal artery     Atherosclerotic heart disease of native coronary artery without angina pectoris    Cigarette nicotine dependence without complication 07/06/2020   ETD (Eustachian tube dysfunction), bilateral 01/11/2017   Hiatal hernia    Hyperlipidemia    Obstructive sleep apnea    no CPAP use   Personal history of colonic polyps 07/06/2020   Pilonidal cyst    Pure hypercholesterolemia    Renal artery stenosis 07/06/2020   Seasonal allergic rhinitis 01/11/2017   Sensorineural hearing loss (SNHL) of both ears 02/22/2017   Tinnitus, bilateral 02/22/2017    Past Surgical History:  Procedure Laterality Date   JOINT REPLACEMENT Right 2005   Knee   KNEE SURGERY     right    Current Medications: Current Meds  Medication Sig   acetaminophen (TYLENOL) 500 MG tablet Take 1,000 mg by mouth every 6 (six) hours as needed for mild pain, moderate pain, fever or headache.   alendronate (FOSAMAX) 70 MG tablet    aspirin EC 81 MG tablet Take 1 tablet (81 mg total) by mouth daily. Swallow whole.   atorvastatin (LIPITOR) 20 MG tablet Take 20 mg by mouth at bedtime.   clotrimazole-betamethasone (LOTRISONE) cream    fluticasone (FLONASE) 50 MCG/ACT nasal spray Place into the nose.   memantine (NAMENDA) 5 MG tablet Take 1 tablet (5 mg at night) for 2 weeks, then increase to 1 tablet (5 mg) twice a day   valACYclovir (VALTREX) 1000 MG tablet Take by mouth.  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  Tobacco Use   Smoking status: Former    Packs/day: 0.50    Years: 43.00    Additional pack years: 0.00    Total pack years: 21.50    Types: Cigarettes    Start date: 10/03/1966    Quit date: 02/12/2022    Years since quitting: 0.8    Passive  exposure: Never   Smokeless tobacco: Never  Vaping Use   Vaping Use: Never used  Substance and Sexual Activity   Alcohol use: Yes    Alcohol/week: 20.0 standard drinks of alcohol    Types: 20 Cans of beer per week    Comment: 4 beers per night on average, 5 nights per week   Drug use: No   Sexual activity: Not on file  Other Topics Concern   Not on file  Social History Narrative   ** Merged History Encounter **       Right handed One story home Drinks caffeine   Social Determinants of Health   Financial Resource Strain: Not on file  Food Insecurity: Not on file  Transportation Needs: Not on file  Physical Activity: Not on file  Stress: Not on file  Social Connections: Not on file     Family History: The patient's family history includes Dementia in his paternal grandfather; Memory loss in his maternal aunt.  ROS:   Please see the history of present illness.     EKGs/Labs/Other Studies Reviewed:    EKG:  The ekg ordered today demonstrates sinus bradycardia, heart rate 51.  Recent Labs: No results found for requested labs within last 365 days.   Recent Lipid Panel No results found for: "CHOL", "TRIG", "HDL", "LDLCALC", "LDLDIRECT"  Physical Exam:    VS:  BP 114/74   Pulse (!) 51   Ht  (1.905 m)   Wt 193 lb (87.5 kg)   SpO2 95%   BMI 24.12 kg/m    No data found.   Wt Readings from Last 3 Encounters:  12/26/22 193 lb (87.5 kg)  11/28/22 193 lb (87.5 kg)  06/06/22 177 lb (80.3 kg)     GEN: Well nourished, well developed in no acute distress HEENT: Normal NECK: No JVD; No carotid bruits CARDIAC: RRR, no murmurs, rubs, gallops RESPIRATORY:  Clear to auscultation without rales, wheezing or rhonchi  ABDOMEN: Soft, non-tender, non-distended MUSCULOSKELETAL: No edema SKIN: Warm and dry NEUROLOGIC:  Alert and oriented PSYCHIATRIC:  Normal affect     ASSESSMENT AND PLAN   Sinus bradycardia, asymptomatic -Historically heart rate often in the 50s,  asymptomatic -EKG with sinus bradycardia heart rate 51, no AV block. -Not on AV nodal blocking agents -Recommend discussing sleep study with his PCP.  Coronary artery disease, no angina -Calcium score 971 on 06/09/2021 (89th percentile). Lexiscan Myoview on 06/21/2021 normal perfusion, EF 63%. -CTA chest aorta 06/2022: Four-vessel coronary artery calcifications.  -Congratulated on tobacco cessation. -Continue aspirin and Lipitor. -Discussed signs/symptoms of ischemic disease.  AAA Dilation of left & right common iliac artery -Followed by Dr Lenell Antu in vascular surgery; stable on 02/2022 AAA duplex  Hyperlipidemia with goal LDL <55 -LDL 57 in October  2023.  Continue Lipitor 20 mg daily. -Discussed cholesterol lowering diets - Mediterranean diet, DASH diet, vegetarian diet, low-carbohydrate diet and avoidance of trans fats.  Discussed healthier choice substitutes.  Nuts, high-fiber foods, and fiber supplements may also improve lipids.    Disposition - Follow-up in 1 year.   Medication Adjustments/Labs and Tests Ordered: Current medicines are reviewed at length with the patient today.  Concerns regarding medicines are outlined above.  Orders Placed This Encounter  Procedures   EKG 12-Lead   No orders of the defined types were placed in this encounter.   Patient Instructions  Medication Instructions:  No changes *If you need a refill on your cardiac medications before your next appointment, please call your pharmacy*   Lab Work: No Labs If you have labs (blood work) drawn today and your tests are completely normal, you will receive your results only by: MyChart Message (if you have MyChart) OR A paper copy in the mail If you have any lab test that is abnormal or we need to change your treatment, we will call you to review the results.   Testing/Procedures: No Testing   Follow-Up: At [facility], you and your health needs are our priority.  As part of our  continuing mission to provide you with exceptional heart care, we have created designated Provider Care Teams.  These Care Teams include your primary Cardiologist (physician) and Advanced Practice Providers (APPs -  Physician Assistants and Nurse Practitioners) who all work together to provide you with the care you need, when you need it.  We recommend signing up for the patient portal called "MyChart".  Sign up information is provided on this After Visit Summary.  MyChart is used to connect with patients for Virtual Visits (Telemedicine).  Patients are able to view lab/test results, encounter notes, upcoming appointments, etc.  Non-urgent messages can be sent to your provider as well.   To learn more about what you can do with MyChart, go to [email or web address].    Your next appointment:   1 year(s)  Provider:   Little Ishikawa, MD   Other Instructions Recommend discussing Sleep Study with Primary Care Provider.   Signed, Cannon Kettle, PA-C  12/26/2022 10:04 AM    [facility]

## 2022-12-26 ENCOUNTER — Ambulatory Visit: Payer: Medicare Other | Attending: Physician Assistant | Admitting: Physician Assistant

## 2022-12-26 ENCOUNTER — Encounter: Payer: Self-pay | Admitting: Physician Assistant

## 2022-12-26 VITALS — BP 114/74 | HR 51 | Ht 75.0 in | Wt 193.0 lb

## 2022-12-26 DIAGNOSIS — Z720 Tobacco use: Secondary | ICD-10-CM | POA: Insufficient documentation

## 2022-12-26 DIAGNOSIS — I2510 Atherosclerotic heart disease of native coronary artery without angina pectoris: Secondary | ICD-10-CM | POA: Diagnosis not present

## 2022-12-26 DIAGNOSIS — E785 Hyperlipidemia, unspecified: Secondary | ICD-10-CM | POA: Insufficient documentation

## 2022-12-26 DIAGNOSIS — R931 Abnormal findings on diagnostic imaging of heart and coronary circulation: Secondary | ICD-10-CM | POA: Insufficient documentation

## 2022-12-26 NOTE — Patient Instructions (Signed)
Medication Instructions:  No changes *If you need a refill on your cardiac medications before your next appointment, please call your pharmacy*   Lab Work: No Labs If you have labs (blood work) drawn today and your tests are completely normal, you will receive your results only by: MyChart Message (if you have MyChart) OR A paper copy in the mail If you have any lab test that is abnormal or we need to change your treatment, we will call you to review the results.   Testing/Procedures: No Testing   Follow-Up: At [facility], you and your health needs are our priority.  As part of our continuing mission to provide you with exceptional heart care, we have created designated Provider Care Teams.  These Care Teams include your primary Cardiologist (physician) and Advanced Practice Providers (APPs -  Physician Assistants and Nurse Practitioners) who all work together to provide you with the care you need, when you need it.  We recommend signing up for the patient portal called "MyChart".  Sign up information is provided on this After Visit Summary.  MyChart is used to connect with patients for Virtual Visits (Telemedicine).  Patients are able to view lab/test results, encounter notes, upcoming appointments, etc.  Non-urgent messages can be sent to your provider as well.   To learn more about what you can do with MyChart, go to [email or web address].    Your next appointment:   1 year(s)  Provider:   Little Ishikawa, MD   Other Instructions Recommend discussing Sleep Study with Primary Care Provider.

## 2023-01-17 ENCOUNTER — Encounter: Payer: Self-pay | Admitting: Psychology

## 2023-01-18 ENCOUNTER — Ambulatory Visit: Payer: Medicare Other

## 2023-01-18 ENCOUNTER — Encounter: Payer: Self-pay | Admitting: Psychology

## 2023-01-18 ENCOUNTER — Ambulatory Visit (INDEPENDENT_AMBULATORY_CARE_PROVIDER_SITE_OTHER): Payer: Medicare Other | Admitting: Psychology

## 2023-01-18 DIAGNOSIS — G3184 Mild cognitive impairment, so stated: Secondary | ICD-10-CM

## 2023-01-18 DIAGNOSIS — R4189 Other symptoms and signs involving cognitive functions and awareness: Secondary | ICD-10-CM

## 2023-01-18 DIAGNOSIS — F1011 Alcohol abuse, in remission: Secondary | ICD-10-CM

## 2023-01-18 NOTE — Progress Notes (Signed)
   Psychometrician Note   Cognitive testing was administered to [name] by Shan Levans, B.S. (psychometrist) under the supervision of Dr. Newman Nickels, Ph.D., licensed psychologist on 01/18/2023. Mr. Defer did not appear overtly distressed by the testing session per behavioral observation or responses across self-report questionnaires. Rest breaks were offered.    The battery of tests administered was selected by Dr. Newman Nickels, Ph.D. with consideration to Mr. Huaman current level of functioning, the nature of his symptoms, emotional and behavioral responses during interview, level of literacy, observed level of motivation/effort, and the nature of the referral question. This battery was communicated to the psychometrist. Communication between Dr. Newman Nickels, Ph.D. and the psychometrist was ongoing throughout the evaluation and Dr. Newman Nickels, Ph.D. was immediately accessible at all times. Dr. Newman Nickels, Ph.D. provided supervision to the psychometrist on the date of this service to the extent necessary to assure the quality of all services provided.    RYELAN LEVENGOOD will return within approximately 1-2 weeks for an interactive feedback session with Dr. Milbert Coulter at which time his test performances, clinical impressions, and treatment recommendations will be reviewed in detail. Mr. Mousel understands he can contact our office should he require our assistance before this time.  A total of 130 minutes of billable time were spent face-to-face with Mr. Malburg by the psychometrist. This includes both test administration and scoring time. Billing for these services is reflected in the clinical report generated by Dr. Newman Nickels, Ph.D.  This note reflects time spent with the psychometrician and does not include test scores or any clinical interpretations made by Dr. Milbert Coulter. The full report will follow in a separate note.

## 2023-01-18 NOTE — Progress Notes (Signed)
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 Scaled Score Percentile   WAIS-IV Similarities: --- 5 5 Well Below Average        D-KEFS Color-Word Interference Test: Raw Score (Scaled Score) Raw Score (Scaled Score) Percentile     Color Naming 46 secs. (4) 46 secs. (4) 2 Well Below Average    Word Reading 29 secs. (8) 27 secs. (9) 37 Average    Inhibition 101 secs. (4) 121 secs. (1) <1 Exceptionally Low      Total Errors 3 errors (9) 4 errors (8) 25 Average    Inhibition/Switching 156 secs. (1) 151 secs. (1) <1 Exceptionally Low      Total Errors 0 errors (13) 16 errors (1) <1 Exceptionally Low        D-KEFS Verbal Fluency Test: Raw Score (Scaled Score) Raw Score (Scaled Score) Percentile     Letter Total Correct 40 (11) 40 (11) 63 Average    Category Total Correct 25 (6) 31 (8) 25 Average    Category Switching Total Correct 9 (6) 4 (1) <1 Exceptionally Low    Category Switching Accuracy 6 (5) 2 (1) <1 Exceptionally Low      Total Set Loss Errors 2 (10) 3 (9) 37 Average      Total Repetition Errors 3 (10) 5 (8) 25 Average        NAB Executive Functions Module, Form 1: T Score T Score Percentile     Judgment 57 48 42 Average        Language:      Verbal Fluency Test: Raw Score (T  Score) Raw Score (T Score) Percentile     Phonemic Fluency (FAS) 40 (48) 40 (48) 42 Average    Animal Fluency 15 (41) 15 (41) 18 Below Average         NAB Language Module, Form 1: T Score T Score Percentile     Auditory Comprehension 36 55 69 Average    Naming 31/31 (55) 29/31 (41) 18 Below Average        Visuospatial/Visuoconstruction:       Raw Score Raw Score Percentile  Clock Drawing: 7/10 4/10 --- Impaired        NAB Spatial Module, Form 1: T Score T Score Percentile     Figure Drawing Copy 46 26 1 Exceptionally Low         Scaled Score Scaled Score Percentile   WAIS-IV Block Design: 3 4 2  Well Below Average        Mood and Personality:       Raw Score Raw Score Percentile   Beck Depression Inventory - II: 6 6 --- Within Normal Limits  PROMIS Anxiety Questionnaire: 10 22 --- Moderate        Additional Questionnaires:       Raw Score Raw Score Percentile   PROMIS Sleep Disturbance Questionnaire: 20 12 --- None to Slight   Informed Consent and Coding/Compliance:   The current evaluation represents a clinical evaluation for the purposes previously outlined by the referral source and is in no way reflective of a forensic evaluation.   Mr. Mimnaugh was provided with a verbal description of the nature and purpose of the present neuropsychological evaluation. Also reviewed were the foreseeable risks and/or discomforts and benefits of the procedure, limits of confidentiality, and mandatory reporting requirements of this provider. The patient was given the opportunity to ask questions and receive answers about the evaluation. Oral consent to participate was provided by the patient.   This evaluation was conducted by Newman Nickels, Ph.D., ABPP-CN, board certified clinical neuropsychologist. Mr. Holeman completed a clinical interview with Dr. Milbert Coulter, billed as one unit [phone], and 130 minutes of cognitive testing and scoring, billed as one unit [phone] and three additional units [id number].  Psychometrist Shan Levans, B.S., assisted Dr. Milbert Coulter with test administration and scoring procedures. As a separate and discrete service, one unit [id number] and two units [phone] were billed for Dr. Tammy Sours time spent in interpretation and report writing.

## 2023-02-12 ENCOUNTER — Ambulatory Visit (INDEPENDENT_AMBULATORY_CARE_PROVIDER_SITE_OTHER): Payer: Medicare Other | Admitting: Psychology

## 2023-02-12 DIAGNOSIS — F1011 Alcohol abuse, in remission: Secondary | ICD-10-CM | POA: Diagnosis not present

## 2023-02-12 DIAGNOSIS — G3184 Mild cognitive impairment, so stated: Secondary | ICD-10-CM

## 2023-02-12 NOTE — Progress Notes (Signed)
   Neuropsychology Feedback Session Raymond Robinson. [facility] [place] Department of Neurology  Reason for Referral:   Raymond Robinson is a 67 y.o. right-handed Caucasian male referred by Marlowe Kays, PA-C, to characterize his current cognitive functioning and assist with diagnostic clarity and treatment planning in the context of a prior amnestic MCI diagnosis and concerns for progressive decline.   Feedback:   Raymond Robinson completed a comprehensive neuropsychological evaluation on 01/18/2023. Please refer to that encounter for the full report and recommendations. Briefly, results suggested severe impairment surrounding all aspects of verbal memory. Additionally severe impairments were exhibited across complex attention, executive functioning, and visuospatial abilities. Processing speed was mildly variable but overall far below expectation. Relative to his previous evaluation in March 2023, fairly notable decline was observed across complex attention, executive functioning, visuospatial abilities. More mild decline was exhibited across verbal memory, particularly recognition/consolidation aspects of memory testing. Other assessed domains were largely stable. Regarding etiology, I continue to have concerns surrounding an underlying neurodegenerative illness. Raymond Robinson' memory patterns continue to be worrisome for Alzheimer's disease as he was fully amnestic (i.e., 0% retention) across all three verbal tasks after a brief delay and performed very poorly across yes/no recognition trials. This continues to suggest rapid forgetting and a fairly prominent storage impairment, both of which are the hallmark characteristics of this illness. Continued appropriate performances across semantic fluency and confrontation naming tasks remains encouraging from the perspective of traditional Alzheimer's disease patterns. His prominent decline in other cognitive domains, particularly visuospatial abilities, could  elevate concerns surrounding an atypical Alzheimer's disease presentation (e.g., posterior cortical atrophy). Progressive decline was observed despite a fairly significant reduction in alcohol consumption. I continue to believe that his history of alcohol use/abuse serves to worsen deficits due to a neurodegenerative illness rather than account for all dysfunction.  Raymond Robinson was accompanied by his wife during the current feedback session. Content of the current session focused on the results of his neuropsychological evaluation. Raymond Robinson was given the opportunity to ask questions and his questions were answered. He was encouraged to reach out should additional questions arise. A copy of his report was provided at the conclusion of the visit.      One unit [phone] was billed for Dr. Tammy Sours time spent preparing for, conducting, and documenting the current feedback session with Raymond Robinson.

## 2023-03-28 DIAGNOSIS — H524 Presbyopia: Secondary | ICD-10-CM | POA: Diagnosis not present

## 2023-03-28 DIAGNOSIS — H40013 Open angle with borderline findings, low risk, bilateral: Secondary | ICD-10-CM | POA: Diagnosis not present

## 2023-05-16 DIAGNOSIS — R972 Elevated prostate specific antigen [PSA]: Secondary | ICD-10-CM | POA: Diagnosis not present

## 2023-05-23 DIAGNOSIS — N400 Enlarged prostate without lower urinary tract symptoms: Secondary | ICD-10-CM | POA: Diagnosis not present

## 2023-05-23 DIAGNOSIS — R972 Elevated prostate specific antigen [PSA]: Secondary | ICD-10-CM | POA: Diagnosis not present

## 2023-05-31 ENCOUNTER — Telehealth (INDEPENDENT_AMBULATORY_CARE_PROVIDER_SITE_OTHER): Payer: Medicare Other | Admitting: Physician Assistant

## 2023-05-31 DIAGNOSIS — G3184 Mild cognitive impairment, so stated: Secondary | ICD-10-CM | POA: Diagnosis not present

## 2023-05-31 NOTE — Patient Instructions (Signed)
It was a pleasure to see you today at our office.   Recommendations:  Follow up in  6 months Will order repeat scan of the brain to look for amyloid Recommend B12 and B1 replenishment daily, follow with PCP Continue memantine 5mg  tablets twice daily.     Follow-up OSA with PCP, may need to discussed repeating sleep study   Whom to call:  Memory  decline, memory medications: Call our office [phone]   For psychiatric meds, mood meds: Please have your primary care physician manage these medications.   Counseling regarding caregiver distress, including caregiver depression, anxiety and issues regarding community resources, adult day care programs, adult living facilities, or memory care questions:   Feel free to contact Misty Lisabeth Register, Social Worker at [phone]   For assessment of decision of mental capacity and competency:  Call Dr. Erick Blinks, geriatric psychiatrist at [phone]  If you have any severe symptoms of a stroke, or other severe issues such as confusion,severe chills or fever, etc call 911 or go to the ER as you may need to be evaluated further     Feel free to go to the following database for funded clinical studies conducted around the world: RankChecks.se   https://www.triadclinicaltrials.com/     RECOMMENDATIONS FOR ALL PATIENTS WITH MEMORY PROBLEMS: 1. Continue to exercise (Recommend 30 minutes of walking everyday, or 3 hours every week) 2. Increase social interactions - continue going to [place] and enjoy social gatherings with friends and family 3. Eat healthy, avoid fried foods and eat more fruits and vegetables 4. Maintain adequate blood pressure, blood sugar, and blood cholesterol level. Reducing the risk of stroke and cardiovascular disease also helps promoting better memory. 5. Avoid stressful situations. Live a simple life and avoid aggravations. Organize your time and prepare for the next day in anticipation. 6. Sleep  well, avoid any interruptions of sleep and avoid any distractions in the bedroom that may interfere with adequate sleep quality 7. Avoid sugar, avoid sweets as there is a strong link between excessive sugar intake, diabetes, and cognitive impairment We discussed the Mediterranean diet, which has been shown to help patients reduce the risk of progressive memory disorders and reduces cardiovascular risk. This includes eating fish, eat fruits and green leafy vegetables, nuts like almonds and hazelnuts, walnuts, and also use olive oil. Avoid fast foods and fried foods as much as possible. Avoid sweets and sugar as sugar use has been linked to worsening of memory function.  There is always a concern of gradual progression of memory problems. If this is the case, then we may need to adjust level of care according to patient needs. Support, both to the patient and caregiver, should then be put into place.    FALL PRECAUTIONS: Be cautious when walking. Scan the area for obstacles that may increase the risk of trips and falls. When getting up in the mornings, sit up at the edge of the bed for a few minutes before getting out of bed. Consider elevating the bed at the head end to avoid drop of blood pressure when getting up. Walk always in a well-lit room (use night lights in the walls). Avoid area rugs or power cords from appliances in the middle of the walkways. Use a walker or a cane if necessary and consider physical therapy for balance exercise. Get your eyesight checked regularly.  FINANCIAL OVERSIGHT: Supervision, especially oversight when making financial decisions or transactions is also recommended.  HOME SAFETY: Consider the safety of the  kitchen when operating appliances like stoves, microwave oven, and blender. Consider having supervision and share cooking responsibilities until no longer able to participate in those. Accidents with firearms and other hazards in the house should be identified and  addressed as well.   ABILITY TO BE LEFT ALONE: If patient is unable to contact 911 operator, consider using LifeLine, or when the need is there, arrange for someone to stay with patients. Smoking is a fire hazard, consider supervision or cessation. Risk of wandering should be assessed by caregiver and if detected at any point, supervision and safe proof recommendations should be instituted.  MEDICATION SUPERVISION: Inability to self-administer medication needs to be constantly addressed. Implement a mechanism to ensure safe administration of the medications.   DRIVING: Regarding driving, in patients with progressive memory problems, driving will be impaired. We advise to have someone else do the driving if trouble finding directions or if minor accidents are reported. Independent driving assessment is available to determine safety of driving.   If you are interested in the driving assessment, you can contact the following:  [facility] in [place] [phone]  [facility] [phone]  [facility] [phone]  [facility] [phone] or [phone]

## 2023-05-31 NOTE — Progress Notes (Signed)
Virtual Visit via Video Note The purpose of this virtual visit is to provide medical care in a patient that is unable to be seen in person due to dangerous weather conditions.  Consent was obtained for video visit:  yes  Answered questions that patient had about telehealth interaction:  yes I discussed the limitations, risks, security and privacy concerns of performing an evaluation and management service by telemedicine. I also discussed with the patient that there may be a patient responsible charge related to this service. The patient expressed understanding and agreed to proceed.  Pt location: Home Physician Location: office Name of referring provider:  Darrow Bussing, MD I connected with Raymond Robinson at patients initiation/request on 05/31/2023 at  9:00 AM EDT by video enabled telemedicine application and verified that I am speaking with the correct person using two identifiers. Pt MRN:  [id number] Pt DOB:  24-Jul-1956 Video Participants:  ONIX JUMPER; wife      Assessment and Plan:    67 y.o. RH male with a history of B hearing loss , OSA non on CPAP, CAD, AAA, PVD,  history of known lung nodule on surveillance,  amnestic MCI with concerns for Alzheimer's disease as per neuropsychological evaluation 02/2023 seen today in follow up via video visit. He was last seen on 11/28/22. Patient is currently on memantine 5 mg bid (bradycardic with donepezil).  Patient is able to participate on his ADLs and to drive without difficulties    Recommendations:  Repeat neuropsychological evaluation in 15-20 months for clarity of diagnosis and disease trajectory. Continue memantine 5 mg twice daily, side effects discussed Patient discussed with PCP retesting sleep studies for OSA FDG-PET for diagnostic clarity as recommended by Neuropsychological evaluation Continue B12 and B1 supplements    History of Present Illness:    Any changes in memory?I don't fell it got a lot worse. "He can't  handle multitasking as before. He denies any changes, he is able to recall conversations and participates in them, able to recall names "white cells.  Enjoys playing solitaire on the computer, word finding, and Jeopardy. repeats oneself? Endorsed "he does not realize"-wife says.  Disoriented when walking into a room?  Denies Leaving objects in unusual places?  Denies Ambulates with difficulty?  Denies Recent falls?  Denies Any head injuries?  Denies History of seizures?  Denies Wandering behavior?  Denies Any mood changes? Agitation ?  Sometimes the patient gets anxious and frustrated regarding his memory. Any history of depression?:  Denies Hallucinations?Denies Paranoia?Denies Patient reports  no issues falling asleep but does not have the quality of sleep, without vivid dreams, REM behavior or sleepwalking.  History of sleep apnea?  Endorsed, not on CPAP at this time. Any hygiene concerns?  Denies Independent of bathing and dressing?  Denies Does the patient needs help with medications?  Patient is in charge, uses a pillbox.   Was in charge of the finances?  Wife is in charge. Any changes in appetite? Denies Patient have trouble swallowing?  Denies Any headaches?  Denies double vision?  Denies Any focal numbness or tingling?  Denies Any pain?  Denies Unilateral weakness?  Denies Any tremors?  Denies Any incontinence of urine?  Denies Any bowel dysfunction?  Denies.    Does the patient drive?  Only on familiar roads, using his GPS, denies getting lost  Tobacco? Quit smoking 1 y ago.  Alcohol? Not every day, 2 beers a weekend.      Neuropsychological evaluation 02/2023 Briefly, results suggested  severe impairment surrounding all aspects of verbal memory. Additionally severe impairments were exhibited across complex attention, executive functioning, and visuospatial abilities. Processing speed was mildly variable but overall far below expectation. Relative to his previous evaluation  in March 2023, fairly notable decline was observed across complex attention, executive functioning, visuospatial abilities. More mild decline was exhibited across verbal memory, particularly recognition/consolidation aspects of memory testing. Other assessed domains were largely stable. Regarding etiology, I continue to have concerns surrounding an underlying neurodegenerative illness. Mr. Raymond Robinson' memory patterns continue to be worrisome for Alzheimer's disease as he was fully amnestic (i.e., 0% retention) across all three verbal tasks after a brief delay and performed very poorly across yes/no recognition trials. This continues to suggest rapid forgetting and a fairly prominent storage impairment, both of which are the hallmark characteristics of this illness. Continued appropriate performances across semantic fluency and confrontation naming tasks remains encouraging from the perspective of traditional Alzheimer's disease patterns. His prominent decline in other cognitive domains, particularly visuospatial abilities, could elevate concerns surrounding an atypical Alzheimer's disease presentation (e.g., posterior cortical atrophy). Progressive decline was observed despite a fairly significant reduction in alcohol consumption. I continue to believe that his history of alcohol use/abuse serves to worsen deficits due to a neurodegenerative illness rather than account for all dysfunction.   Initial visit 06/22/2021  The patient is seen in neurologic consultation at the request of Raymond, Dibas, MD for the evaluation of memory.  The patient is here alone. 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  He lives with his wife, who noticed these, and encouraged him to seek medical attention.  He states that his memory perhaps was  worsened in June of this year until 3 weeks ago, when he was overseeing renovations to his house, which he reports was a stressful time.  He also states that COVID pandemic affected his mood and increased the anxiety, as he could not do the things he wanted to.  He denies a history of depression.  On his free time, he likes to play solitaire and other computer games.  He sleeps well, denies any vivid dreams or sleepwalking, hallucinations or paranoia.  He denies leaving objects in unusual places, but he continues to me splays his keys in glasses, for which his wife 3 weeks ago boarding and monitors to "keep all the stuff in one place ".  He denies any assistance with bathing and dressing.  He is in charge of his medications and denies missing any doses.  He uses a pillbox.  His wife, who is a IT trainer, has always been doing the finances.  His appetite is good, denies trouble swallowing.  He does not cook very frequently, and when he does so, denies leaving the stove or the faucet on.  He ambulates without difficulty without the use of a walker or a cane, denies any fall or recent head injuries.  In 1988, [place] [address].  No other injuries since then.  He drives without GPS unless he goes to unfamiliar places or out of state.  He denies any headaches, double vision, dizziness, focal numbness or tingling, unilateral weakness or tremors, urine incontinence or retention, constipation or diarrhea.  He denies anosmia.  He denies a history of alcohol.  He smokes about half pack a  day, decreased from 6 years ago when he was at 1-1/2 packs/day.  He is planning to quit.  He has a prior history of sleep apnea, had a sleep study at the time, did not like the CPAP, so he lost weight and his symptoms improved.  Family history remarkable for dementia in mother.      Prior medications: donepezil  bradycardia)   Current Outpatient Medications on File Prior to Visit  Medication Sig Dispense Refill    acetaminophen (TYLENOL) 500 MG tablet Take 1,000 mg by mouth every 6 (six) hours as needed for mild pain, moderate pain, fever or headache.     alendronate (FOSAMAX) 70 MG tablet      aspirin EC 81 MG tablet Take 1 tablet (81 mg total) by mouth daily. Swallow whole. 90 tablet 3   atorvastatin (LIPITOR) 20 MG tablet Take 20 mg by mouth at bedtime.     clotrimazole-betamethasone (LOTRISONE) cream      fluticasone (FLONASE) 50 MCG/ACT nasal spray Place into the nose.     memantine (NAMENDA) 5 MG tablet Take 1 tablet (5 mg at night) for 2 weeks, then increase to 1 tablet (5 mg) twice a day 60 tablet 11   valACYclovir (VALTREX) 1000 MG tablet Take by mouth.     No current facility-administered medications on file prior to visit.     Observations/Objective:   There were no vitals filed for this visit. GEN:  The patient appears stated age and is in NAD.  Neurological examination: Patient is awake, alert, oriented to person, place and date.. No aphasia or dysarthria. Intact fluency, normal comprehension.. Remote and recent memory impaired. Cranial nerves: Extraocular movements intact with no nystagmus. No facial asymmetry. Motor: moves all extremities symmetrically, at least anti-gravity x 4. No incoordination on finger to nose testing . Gait: narrow-based and steady, able to tandem walk adequately.      Follow Up Instructions:    -I discussed the assessment and treatment plan with the patient. The patient was provided an opportunity to ask questions and all were answered. The patient agreed with the plan and demonstrated an understanding of the instructions.   The patient was advised to call back or seek an in-person evaluation if the symptoms worsen or if the condition fails to improve as anticipated.    Total time spent on today's visit was , including both face-to-face time and nonface-to-face time.  Time included that spent on review of records (prior notes available to  me/labs/imaging if pertinent), discussing treatment and goals, answering patient's questions and coordinating care.   Marlowe Kays, PA-C

## 2023-06-03 ENCOUNTER — Telehealth: Payer: Self-pay | Admitting: Physician Assistant

## 2023-06-03 DIAGNOSIS — G3184 Mild cognitive impairment, so stated: Secondary | ICD-10-CM

## 2023-06-03 NOTE — Telephone Encounter (Signed)
I placed order for MRI of the brain at [facility] 06/03/2023 at 3:50

## 2023-06-03 NOTE — Telephone Encounter (Signed)
Left message will call back at 2:03 06/03/2023

## 2023-06-03 NOTE — Telephone Encounter (Signed)
Patients wife returned missed phone call

## 2023-06-03 NOTE — Telephone Encounter (Signed)
Patient needs an MRI when he calls back, will need to order. Awaiting to discuss this first before placing order.

## 2023-06-03 NOTE — Telephone Encounter (Signed)
Patients wife is returning a call to christy

## 2023-06-04 ENCOUNTER — Other Ambulatory Visit: Payer: Self-pay | Admitting: *Deleted

## 2023-06-04 DIAGNOSIS — I7143 Infrarenal abdominal aortic aneurysm, without rupture: Secondary | ICD-10-CM

## 2023-06-05 DIAGNOSIS — Z23 Encounter for immunization: Secondary | ICD-10-CM | POA: Diagnosis not present

## 2023-06-17 DIAGNOSIS — Z79899 Other long term (current) drug therapy: Secondary | ICD-10-CM | POA: Diagnosis not present

## 2023-06-17 DIAGNOSIS — I700 Atherosclerosis of aorta: Secondary | ICD-10-CM | POA: Diagnosis not present

## 2023-06-17 DIAGNOSIS — I7143 Infrarenal abdominal aortic aneurysm, without rupture: Secondary | ICD-10-CM | POA: Diagnosis not present

## 2023-06-17 DIAGNOSIS — I2510 Atherosclerotic heart disease of native coronary artery without angina pectoris: Secondary | ICD-10-CM | POA: Diagnosis not present

## 2023-06-17 DIAGNOSIS — I701 Atherosclerosis of renal artery: Secondary | ICD-10-CM | POA: Diagnosis not present

## 2023-06-17 DIAGNOSIS — Z0001 Encounter for general adult medical examination with abnormal findings: Secondary | ICD-10-CM | POA: Diagnosis not present

## 2023-06-17 DIAGNOSIS — I723 Aneurysm of iliac artery: Secondary | ICD-10-CM | POA: Diagnosis not present

## 2023-06-17 DIAGNOSIS — F03A0 Unspecified dementia, mild, without behavioral disturbance, psychotic disturbance, mood disturbance, and anxiety: Secondary | ICD-10-CM | POA: Diagnosis not present

## 2023-06-17 DIAGNOSIS — E7800 Pure hypercholesterolemia, unspecified: Secondary | ICD-10-CM | POA: Diagnosis not present

## 2023-06-17 NOTE — Progress Notes (Unsigned)
ASSESSMENT & PLAN:  67 y.o. male with small abdominal aortic aneurysm (40mm), left common iliac artery aneurysm (30mm), right common iliac artery aneurysm (21mm). Incidental right renal artery stenosis (~60%) without difficult to control hypertension or renal dysfunction.   None of the aneurysms are at the traditional threshold for repair.   For atherosclerosis, recommend the following to reduce the risk of adverse cardiac/cerebrovascular/peripheral vascular events:  Complete cessation from all tobacco products. Blood glucose control with goal A1c < 7%. Blood pressure control with goal blood pressure < 140/90 mmHg. Lipid reduction therapy with goal LDL-C <100 mg/dL (<96 if symptomatic from PAD).  Aspirin 81mg  PO QD.  Atorvastatin 40-80mg  PO QD (or other "high intensity" statin therapy).  Follow up in 1 year with repeat AAA duplex.  CHIEF COMPLAINT:   Incidental discovery of aneurysm  HISTORY:  HISTORY OF PRESENT ILLNESS: Raymond Robinson is a 67 y.o. male referred to clinic for evaluation of incidental discovery of asymptomatic infrarenal abdominal aortic and bilateral common iliac artery aneurysms.  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  He is a lifelong smoker.  He is try to quit previously, but not had much success.  He is undergoing low-dose chest CT scanning for lung nodule surveillance.  He has no complaints today.  He has multiple excellent questions about surveillance and repair.  01/31/21: Doing very well.  No symptoms referable to his abdominal aortic aneurysm.  We again reviewed the natural history of aneurysm disease.  02/27/22: Doing very well.  No symptoms referable to his abdominal aortic aneurysm.  We again reviewed the natural history of aneurysm disease. Staying busy with projects around the house and with grandchildren.  06/18/23: Doing very well.  No symptoms referable to his abdominal aortic aneurysm.  We again reviewed the natural history of aneurysm  disease.  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   Aneurysm of iliac artery 07/06/2020   Asthma    Atherosclerosis of renal artery    Atherosclerotic heart disease of native coronary artery without angina pectoris    Bacterial sinusitis 02/27/2022   ETD (Eustachian tube dysfunction), bilateral 01/11/2017   Hiatal hernia    History of alcohol abuse    Hyperlipidemia    Obstructive sleep apnea    no CPAP use   Personal history of colonic polyps 07/06/2020   Pilonidal cyst    Pure hypercholesterolemia    Renal artery stenosis 07/06/2020   Seasonal allergic rhinitis 01/11/2017   Sensorineural hearing loss (SNHL) of both ears 02/22/2017   Tinnitus, bilateral 02/22/2017    Past Surgical History:  Procedure Laterality Date   JOINT REPLACEMENT Right 2005   Knee   KNEE SURGERY     right    Family History  Problem Relation Age of Onset   Dementia Paternal Grandfather        wandering behaviors   Memory loss Maternal Aunt     Social History   Socioeconomic History   Marital status: Married    Spouse name: Not on file   Number of children: 3   Years of education: 16   Highest education level: Bachelor's degree (e.g., BA, AB, BS)  Occupational History   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  Tobacco Use   Smoking status: Former    Current packs/day: 0.00    Average packs/day: 0.5 packs/day for 55.4 years (27.7 ttl pk-yrs)    Types: Cigarettes    Start date: 10/03/1966    Quit date: 02/12/2022    Years since quitting: 1.3    Passive exposure: Never   Smokeless tobacco: Never  Vaping Use   Vaping status: Never Used  Substance and Sexual Activity   Alcohol use: Yes    Alcohol/week: 10.0 standard drinks  of alcohol    Types: 10 Cans of beer per week    Comment: 2 beers per night on average, 5 nights per week   Drug use: No   Sexual activity: Not on file  Other Topics Concern   Not on file  Social History Narrative   ** Merged History Encounter **       Right handed One story home Drinks caffeine   Social Determinants of Health   Financial Resource Strain: Not on file  Food Insecurity: Not on file  Transportation Needs: Not on file  Physical Activity: Not on file  Stress: Not on file  Social Connections: Not on file  Intimate Partner Violence: Not on file    Allergies  Allergen Reactions   Penicillins Other (See Comments)    Reaction:  Unknown  Has patient had a PCN reaction causing immediate rash, facial/tongue/throat swelling, SOB or lightheadedness with hypotension: Unsure Has patient had a PCN reaction causing severe rash involving mucus membranes or skin necrosis: Unsure Has patient had a PCN reaction that required hospitalization Unsure Has patient had a PCN reaction occurring within the last 10 years: No If all of the above answers are "NO", then may proceed with Cephalosporin use. Reaction:  Unknown  Has patient had a PCN reaction causing immediate rash, facial/tongue/throat swelling, SOB or lightheadedness with hypotension: Unsure Has patient had a PCN reaction causing severe rash involving mucus membranes or skin necrosis: Unsure Has patient had a PCN reaction that required hospitalization Unsure Has patient had a PCN reaction occurring within the last 10 years: No If all of the above answers are "NO", then may proceed with Cephalosporin use.    Current Outpatient Medications  Medication Sig Dispense Refill   acetaminophen (TYLENOL) 500 MG tablet Take 1,000 mg by mouth every 6 (six) hours as needed for mild pain, moderate pain, fever or headache.     alendronate (FOSAMAX) 70 MG tablet      aspirin EC 81 MG tablet Take 1 tablet (81 mg total) by mouth daily.  Swallow whole. 90 tablet 3   atorvastatin (LIPITOR) 20 MG tablet Take 20 mg by mouth at bedtime.     clotrimazole-betamethasone (LOTRISONE) cream      fluticasone (FLONASE) 50 MCG/ACT nasal spray Place into the nose.     memantine (NAMENDA) 5 MG tablet Take 1 tablet (5 mg at night) for 2 weeks, then increase to 1 tablet (5 mg) twice a day 60 tablet 11   valACYclovir (VALTREX) 1000 MG tablet Take by mouth.     No current facility-administered medications for this visit.   PHYSICAL EXAM:   There were no vitals filed for this visit.   Constitutional: Well appearing in no distress. Appears well nourished.  Neurologic: Normal gait and station. CN intact.  No weakness.  No sensory loss. Psychiatric: Mood and affect symmetric and appropriate. Eyes: No icterus.  No conjunctival pallor. Ears, nose, throat: mucous membranes moist.  Midline  trachea.  Cardiac: regular rate and rhythm.  No murmurs / gallops / rubs. Respiratory: clear to auscultation bilaterally.  No wheezes / rales / rhonchi. Abdominal: soft, non-tender, non-distended.  No palpable pulsatile abdominal mass. Peripheral vascular:  Popliteal pulse: L 1+/ R prominent 2+  Dorsalis pedis pulse: L absent/ R 2+ Extremity: No edema.  No cyanosis.  No pallor.  Rubor noted in bilateral feet with a dependent position. Skin: No gangrene.  No ulceration.  No hair noted about the ankles and feet bilaterally. Lymphatic: No Stemmer's sign.  No palpable lymphadenopathy.  DATA REVIEW:    Most recent CBC    Latest Ref Rng & Units 12/28/2016    6:23 PM  CBC  WBC 4.0 - 10.5 K/uL 14.2   Hemoglobin 13.0 - 17.0 g/dL 40.9   Hematocrit 81.1 - 52.0 % 41.2   Platelets 150 - 400 K/uL 252      Most recent CMP    Latest Ref Rng & Units 12/28/2016    6:23 PM  CMP  Glucose 65 - 99 mg/dL 92   BUN 6 - 20 mg/dL 15   Creatinine 9.14 - 1.24 mg/dL 7.82   Sodium 956 - 213 mmol/L 132   Potassium 3.5 - 5.1 mmol/L 4.1   Chloride 101 - 111 mmol/L 94   CO2  22 - 32 mmol/L 29   Calcium 8.9 - 10.3 mg/dL 9.6      Vascular Imaging: CTA personally reviewed Small infrarenal abdominal aortic aneurysm measuring 36mm in greatest dimension Left common iliac artery aneurysm measuring 27mm Right common iliac artery ectasia measuring 20mm Mild right renal artery stenosis Evidence of calcific atherosclerotic disease throughout  AAA duplex 06/18/23:   small abdominal aortic aneurysm (40mm), left common iliac artery aneurysm (30mm), right common iliac artery aneurysm (21mm).  Rande Brunt. Lenell Antu, MD [facility] Phone Number: [phone] 06/17/2023 8:05 AM

## 2023-06-18 ENCOUNTER — Ambulatory Visit (HOSPITAL_COMMUNITY)
Admission: RE | Admit: 2023-06-18 | Discharge: 2023-06-18 | Disposition: A | Discharge status: Home / Self Care | Payer: Medicare Other | Source: Ambulatory Visit | Attending: Vascular Surgery | Admitting: Vascular Surgery

## 2023-06-18 ENCOUNTER — Encounter: Payer: Self-pay | Admitting: Vascular Surgery

## 2023-06-18 ENCOUNTER — Ambulatory Visit (INDEPENDENT_AMBULATORY_CARE_PROVIDER_SITE_OTHER): Payer: Medicare Other | Admitting: Vascular Surgery

## 2023-06-18 VITALS — BP 118/67 | HR 50 | Temp 98.0°F | Ht 75.0 in | Wt 198.4 lb

## 2023-06-18 DIAGNOSIS — I7143 Infrarenal abdominal aortic aneurysm, without rupture: Secondary | ICD-10-CM | POA: Diagnosis not present

## 2023-06-18 DIAGNOSIS — I723 Aneurysm of iliac artery: Secondary | ICD-10-CM

## 2023-06-25 ENCOUNTER — Ambulatory Visit
Admission: RE | Admit: 2023-06-25 | Discharge: 2023-06-25 | Disposition: A | Discharge status: Home / Self Care | Payer: Medicare Other | Source: Ambulatory Visit | Attending: Physician Assistant | Admitting: Physician Assistant

## 2023-06-25 DIAGNOSIS — G319 Degenerative disease of nervous system, unspecified: Secondary | ICD-10-CM | POA: Diagnosis not present

## 2023-06-25 DIAGNOSIS — R9082 White matter disease, unspecified: Secondary | ICD-10-CM | POA: Diagnosis not present

## 2023-06-25 DIAGNOSIS — R413 Other amnesia: Secondary | ICD-10-CM | POA: Diagnosis not present

## 2023-06-25 NOTE — Progress Notes (Signed)
Results from MRI are similar to prior on 2022, with mild hardening of the small blood vessels in the brain in patients with high cholesterol, blood pressure or sugar control issues, sometimes age as well. It did not show any tumors, stroke or bleed.Thanks

## 2023-06-27 ENCOUNTER — Telehealth: Payer: Self-pay | Admitting: Physician Assistant

## 2023-06-27 DIAGNOSIS — H2513 Age-related nuclear cataract, bilateral: Secondary | ICD-10-CM | POA: Diagnosis not present

## 2023-06-27 DIAGNOSIS — H40013 Open angle with borderline findings, low risk, bilateral: Secondary | ICD-10-CM | POA: Diagnosis not present

## 2023-06-27 DIAGNOSIS — H18413 Arcus senilis, bilateral: Secondary | ICD-10-CM | POA: Diagnosis not present

## 2023-06-27 DIAGNOSIS — H2512 Age-related nuclear cataract, left eye: Secondary | ICD-10-CM | POA: Diagnosis not present

## 2023-06-27 DIAGNOSIS — H25043 Posterior subcapsular polar age-related cataract, bilateral: Secondary | ICD-10-CM | POA: Diagnosis not present

## 2023-06-27 NOTE — Telephone Encounter (Signed)
Pt called in returning a call about results 

## 2023-07-12 ENCOUNTER — Other Ambulatory Visit: Payer: Self-pay

## 2023-07-12 DIAGNOSIS — I7143 Infrarenal abdominal aortic aneurysm, without rupture: Secondary | ICD-10-CM

## 2023-07-18 DIAGNOSIS — H6122 Impacted cerumen, left ear: Secondary | ICD-10-CM | POA: Diagnosis not present

## 2023-07-18 DIAGNOSIS — H903 Sensorineural hearing loss, bilateral: Secondary | ICD-10-CM | POA: Diagnosis not present

## 2023-07-22 DIAGNOSIS — R051 Acute cough: Secondary | ICD-10-CM | POA: Diagnosis not present

## 2023-07-22 DIAGNOSIS — Z03818 Encounter for observation for suspected exposure to other biological agents ruled out: Secondary | ICD-10-CM | POA: Diagnosis not present

## 2023-07-22 DIAGNOSIS — R7981 Abnormal blood-gas level: Secondary | ICD-10-CM | POA: Diagnosis not present

## 2023-07-22 DIAGNOSIS — J189 Pneumonia, unspecified organism: Secondary | ICD-10-CM | POA: Diagnosis not present

## 2023-08-21 DIAGNOSIS — H6121 Impacted cerumen, right ear: Secondary | ICD-10-CM | POA: Diagnosis not present

## 2023-08-22 ENCOUNTER — Ambulatory Visit
Admission: RE | Admit: 2023-08-22 | Discharge: 2023-08-22 | Disposition: A | Discharge status: Home / Self Care | Payer: Medicare Other | Source: Ambulatory Visit | Attending: Family Medicine | Admitting: Family Medicine

## 2023-08-22 DIAGNOSIS — Z136 Encounter for screening for cardiovascular disorders: Secondary | ICD-10-CM | POA: Diagnosis not present

## 2023-08-22 DIAGNOSIS — Z87891 Personal history of nicotine dependence: Secondary | ICD-10-CM | POA: Diagnosis not present

## 2023-08-22 DIAGNOSIS — F17210 Nicotine dependence, cigarettes, uncomplicated: Secondary | ICD-10-CM

## 2023-09-02 DIAGNOSIS — H2511 Age-related nuclear cataract, right eye: Secondary | ICD-10-CM | POA: Diagnosis not present

## 2023-09-02 DIAGNOSIS — H52209 Unspecified astigmatism, unspecified eye: Secondary | ICD-10-CM | POA: Diagnosis not present

## 2023-09-03 ENCOUNTER — Other Ambulatory Visit: Payer: Self-pay

## 2023-09-03 DIAGNOSIS — H25012 Cortical age-related cataract, left eye: Secondary | ICD-10-CM | POA: Diagnosis not present

## 2023-09-03 DIAGNOSIS — H2512 Age-related nuclear cataract, left eye: Secondary | ICD-10-CM | POA: Diagnosis not present

## 2023-09-03 DIAGNOSIS — Z122 Encounter for screening for malignant neoplasm of respiratory organs: Secondary | ICD-10-CM

## 2023-09-03 DIAGNOSIS — Z87891 Personal history of nicotine dependence: Secondary | ICD-10-CM

## 2023-09-03 DIAGNOSIS — H25042 Posterior subcapsular polar age-related cataract, left eye: Secondary | ICD-10-CM | POA: Diagnosis not present

## 2023-09-16 DIAGNOSIS — H2512 Age-related nuclear cataract, left eye: Secondary | ICD-10-CM | POA: Diagnosis not present

## 2023-09-16 DIAGNOSIS — H52209 Unspecified astigmatism, unspecified eye: Secondary | ICD-10-CM | POA: Diagnosis not present

## 2023-09-19 DIAGNOSIS — L723 Sebaceous cyst: Secondary | ICD-10-CM | POA: Diagnosis not present

## 2023-09-19 DIAGNOSIS — L821 Other seborrheic keratosis: Secondary | ICD-10-CM | POA: Diagnosis not present

## 2023-09-19 DIAGNOSIS — L853 Xerosis cutis: Secondary | ICD-10-CM | POA: Diagnosis not present

## 2023-11-13 DIAGNOSIS — R972 Elevated prostate specific antigen [PSA]: Secondary | ICD-10-CM | POA: Diagnosis not present

## 2023-11-25 DIAGNOSIS — I2510 Atherosclerotic heart disease of native coronary artery without angina pectoris: Secondary | ICD-10-CM | POA: Diagnosis not present

## 2023-11-25 DIAGNOSIS — I700 Atherosclerosis of aorta: Secondary | ICD-10-CM | POA: Diagnosis not present

## 2023-11-25 DIAGNOSIS — I701 Atherosclerosis of renal artery: Secondary | ICD-10-CM | POA: Diagnosis not present

## 2023-12-02 DIAGNOSIS — I701 Atherosclerosis of renal artery: Secondary | ICD-10-CM | POA: Diagnosis not present

## 2023-12-02 DIAGNOSIS — I700 Atherosclerosis of aorta: Secondary | ICD-10-CM | POA: Diagnosis not present

## 2023-12-02 DIAGNOSIS — I2510 Atherosclerotic heart disease of native coronary artery without angina pectoris: Secondary | ICD-10-CM | POA: Diagnosis not present

## 2023-12-04 DIAGNOSIS — I701 Atherosclerosis of renal artery: Secondary | ICD-10-CM | POA: Diagnosis not present

## 2023-12-04 DIAGNOSIS — Z79899 Other long term (current) drug therapy: Secondary | ICD-10-CM | POA: Diagnosis not present

## 2023-12-04 DIAGNOSIS — I723 Aneurysm of iliac artery: Secondary | ICD-10-CM | POA: Diagnosis not present

## 2023-12-04 DIAGNOSIS — G4733 Obstructive sleep apnea (adult) (pediatric): Secondary | ICD-10-CM | POA: Diagnosis not present

## 2023-12-04 DIAGNOSIS — I2510 Atherosclerotic heart disease of native coronary artery without angina pectoris: Secondary | ICD-10-CM | POA: Diagnosis not present

## 2023-12-04 DIAGNOSIS — M816 Localized osteoporosis [Lequesne]: Secondary | ICD-10-CM | POA: Diagnosis not present

## 2023-12-04 DIAGNOSIS — F03A0 Unspecified dementia, mild, without behavioral disturbance, psychotic disturbance, mood disturbance, and anxiety: Secondary | ICD-10-CM | POA: Diagnosis not present

## 2023-12-05 ENCOUNTER — Other Ambulatory Visit: Payer: Self-pay | Admitting: Family Medicine

## 2023-12-05 DIAGNOSIS — M816 Localized osteoporosis [Lequesne]: Secondary | ICD-10-CM

## 2023-12-16 ENCOUNTER — Other Ambulatory Visit: Payer: Self-pay | Admitting: Physician Assistant

## 2023-12-24 DIAGNOSIS — I701 Atherosclerosis of renal artery: Secondary | ICD-10-CM | POA: Diagnosis not present

## 2023-12-24 DIAGNOSIS — I700 Atherosclerosis of aorta: Secondary | ICD-10-CM | POA: Diagnosis not present

## 2023-12-24 DIAGNOSIS — I2510 Atherosclerotic heart disease of native coronary artery without angina pectoris: Secondary | ICD-10-CM | POA: Diagnosis not present

## 2023-12-29 NOTE — Progress Notes (Signed)
 Cardiology Office Note:    Date:  01/03/2024   ID:  Raymond Robinson, DOB 09/08/1955, MRN [id number]  PCP:  Lanae Pinal, MD  Cardiologist:  Wendie Hamburg, MD  Electrophysiologist:  None   Referring MD: Lanae Pinal, MD   Chief Complaint  Patient presents with   Coronary Artery Disease    History of Present Illness:    Raymond Robinson is a 68 y.o. male with a hx of abdominal aortic aneurysm, hyperlipidemia, OSA, asthma, tobacco use who presents for follow-up.  He was referred by Dr. Candance Certain for evaluation of coronary calcification seen on CT, initially seen on 04/27/2021. CT chest for lung cancer screening on 02/07/2021 showed significant coronary artery calcifications.  He follows with Dr. Edgardo Goodwill in vascular surgery for abdominal aortic aneurysm (measuring 36 mm), left common iliac artery aneurysm (27 mm), right common iliac artery aneurysm (20 mm).  Also of noted to have right renal artery stenosis without issues with hypertension or renal dysfunction.  Last saw Dr. Edgardo Goodwill May 2022, planned follow-up imaging in 1 year.  Calcium score 971 on 06/09/2021 (89th percentile).  Ascending aortic dilatation measuring 42 mm.  Lexiscan  Myoview  on 06/21/2021 normal perfusion, EF 63%.  Since last clinic visit, he reports he is doing well.  Denies any chest pain, dyspnea, lightheadedness, syncope, lower extremity edema, or palpitations.  Does pilates and also walks couple times per week for 0.5 mile.  Quit smoking in 2023.    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   Aneurysm of iliac artery 07/06/2020   Asthma    Atherosclerosis of renal artery    Atherosclerotic heart disease of native coronary artery without angina pectoris     Bacterial sinusitis 02/27/2022   ETD (Eustachian tube dysfunction), bilateral 01/11/2017   Hiatal hernia    History of alcohol abuse    Hyperlipidemia    Obstructive sleep apnea    no CPAP use   Personal history of colonic polyps 07/06/2020   Pilonidal cyst    Pure hypercholesterolemia    Renal artery stenosis 07/06/2020   Seasonal allergic rhinitis 01/11/2017   Sensorineural hearing loss (SNHL) of both ears 02/22/2017   Tinnitus, bilateral 02/22/2017    Past Surgical History:  Procedure Laterality Date   JOINT REPLACEMENT Right 2005   Knee   KNEE SURGERY     right    Current Medications: Current Meds  Medication Sig   acetaminophen  (TYLENOL ) 500 MG tablet Take 1,000 mg by mouth every 6 (six) hours as needed for mild pain, moderate pain, fever or headache.   alendronate (FOSAMAX) 70 MG tablet    aspirin  EC 81 MG tablet Take 1 tablet (81 mg total) by mouth daily. Swallow whole.   atorvastatin (LIPITOR) 20 MG tablet Take 20 mg by mouth at bedtime.   clotrimazole-betamethasone (LOTRISONE) cream    fluticasone (FLONASE) 50 MCG/ACT nasal spray Place into the nose.   memantine  (NAMENDA ) 5 MG tablet TAKE 1 TABLET(5 MG) BY MOUTH AT NIGHT FOR 2 WEEKS, THEN INCREASE TO 1 TABLET 2 TIMES A DAY   valACYclovir (VALTREX) 1000 MG tablet Take by mouth.     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  Tobacco Use   Smoking status: Former    Current packs/day: 0.00    Average packs/day: 0.5 packs/day for 55.4 years (27.7 ttl pk-yrs)    Types: Cigarettes    Start date: 10/03/1966    Quit date: 02/12/2022    Years since quitting: 1.8    Passive exposure: Never   Smokeless tobacco: Never  Vaping Use   Vaping status: Never  Used  Substance and Sexual Activity   Alcohol use: Yes    Alcohol/week: 10.0 standard drinks of alcohol    Types: 10 Cans of beer per week    Comment: 2 beers per night on average, 5 nights per week   Drug use: No   Sexual activity: Not on file  Other Topics Concern   Not on file  Social History Narrative   ** Merged History Encounter **       Right handed One story home Drinks caffeine   Social Drivers of Corporate investment banker Strain: Not on file  Food Insecurity: Not on file  Transportation Needs: Not on file  Physical Activity: Not on file  Stress: Not on file  Social Connections: Not on file     Family History: Reports both his parents had heart issues but he is unsure of the details.  ROS:   Please see the history of present illness.     All other systems reviewed and are negative.  EKGs/Labs/Other Studies Reviewed:    The following studies were reviewed today:   EKG:   11/08/21: NSR, PAC, rate 55 01/03/2024: Sinus bradycardia, rate 53, no ST abnormalities  Recent Labs: No results found for requested labs within last 365 days.  Recent Lipid Panel No results found for: "CHOL", "TRIG", "HDL", "CHOLHDL", "VLDL", "LDLCALC", "LDLDIRECT"  Physical Exam:    VS:  BP 126/78 (BP Location: Left Arm, Patient Position: Sitting)   Pulse (!) 53   Ht 6\' 3"  (1.905 m)   Wt 199 lb 3.2 oz (90.4 kg)   SpO2 95%   BMI 24.90 kg/m     Wt Readings from Last 3 Encounters:  01/03/24 199 lb 3.2 oz (90.4 kg)  06/18/23 198 lb 6.4 oz (90 kg)  12/26/22 193 lb (87.5 kg)     GEN:  Well nourished, well developed in no acute distress HEENT: Normal NECK: No JVD; No carotid bruits LYMPHATICS: No lymphadenopathy CARDIAC: RRR, no murmurs, rubs, gallops RESPIRATORY:  Clear to auscultation without rales, wheezing or rhonchi  ABDOMEN: Soft, non-tender, non-distended MUSCULOSKELETAL:  No edema; No deformity  SKIN: Warm and dry NEUROLOGIC:  Alert and oriented x 3 PSYCHIATRIC:   Normal affect   ASSESSMENT:    1. CAD in native artery   2. Abdominal aortic aneurysm (AAA) without rupture, unspecified part (HCC)   3. Healthcare maintenance   4. Hyperlipidemia, unspecified hyperlipidemia type      PLAN:    CAD:  Calcium score 971 on 06/09/2021 (89th percentile).  Lexiscan  Myoview  on 06/21/2021 normal perfusion, EF 63%. -Continue atorvastatin 20 mg daily.  LDL 65 in 06/2023 -Continue aspirin  81 mg daily  AAA: He follows with Dr. Edgardo Goodwill in vascular surgery for abdominal aortic aneurysm (measuring 40 mm), left common iliac artery aneurysm (30 mm), right common iliac artery  aneurysm (21 mm).  Also of noted to have right renal artery stenosis without issues with hypertension or renal dysfunction.  Last saw Dr. Edgardo Goodwill October 2024, planned follow-up imaging in 1 year.  Tobacco use: Quit smoking in 2023.  Congratulated patient on quitting and encouraged continued cessation  Hyperlipidemia: LDL 65 on 06/2023.  Continue atorvastatin 20 mg daily  OSA: Reports prior diagnosis but unable to tolerate CPAP.  Subsequently lost 80 to 90 pounds, so may no longer be an issue.  Has upcoming sleep study  Aortic dilatation: Ascending aortic dilatation measuring 42 mm on calcium score 06/21/2021.  CTA chest 06/2022 showed no aortic aneurysm  RTC in 1 year    Medication Adjustments/Labs and Tests Ordered: Current medicines are reviewed at length with the patient today.  Concerns regarding medicines are outlined above.  Orders Placed This Encounter  Procedures   EKG 12-Lead   No orders of the defined types were placed in this encounter.   Patient Instructions  Medication Instructions:  Continue same medications *If you need a refill on your cardiac medications before your next appointment, please call your pharmacy*  Lab Work: None ordered  Testing/Procedures: None ordered  Follow-Up: At [facility], you and your health needs are our priority.  As part of our  continuing mission to provide you with exceptional heart care, our providers are all part of one team.  This team includes your primary Cardiologist (physician) and Advanced Practice Providers or APPs (Physician Assistants and Nurse Practitioners) who all work together to provide you with the care you need, when you need it.  Your next appointment:  1 year   Call in Jan to schedule May appointment    Provider:  Dr.Tonja Jezewski   We recommend signing up for the patient portal called "MyChart".  Sign up information is provided on this After Visit Summary.  MyChart is used to connect with patients for Virtual Visits (Telemedicine).  Patients are able to view lab/test results, encounter notes, upcoming appointments, etc.  Non-urgent messages can be sent to your provider as well.   To learn more about what you can do with MyChart, go to [email or web address]       Signed, Wendie Hamburg, MD  01/03/2024 8:37 AM    [facility]

## 2023-12-30 DIAGNOSIS — R413 Other amnesia: Secondary | ICD-10-CM | POA: Diagnosis not present

## 2023-12-30 DIAGNOSIS — G4719 Other hypersomnia: Secondary | ICD-10-CM | POA: Diagnosis not present

## 2023-12-30 DIAGNOSIS — I2510 Atherosclerotic heart disease of native coronary artery without angina pectoris: Secondary | ICD-10-CM | POA: Diagnosis not present

## 2024-01-01 DIAGNOSIS — E7800 Pure hypercholesterolemia, unspecified: Secondary | ICD-10-CM | POA: Diagnosis not present

## 2024-01-01 DIAGNOSIS — I2510 Atherosclerotic heart disease of native coronary artery without angina pectoris: Secondary | ICD-10-CM | POA: Diagnosis not present

## 2024-01-01 DIAGNOSIS — I701 Atherosclerosis of renal artery: Secondary | ICD-10-CM | POA: Diagnosis not present

## 2024-01-01 DIAGNOSIS — I700 Atherosclerosis of aorta: Secondary | ICD-10-CM | POA: Diagnosis not present

## 2024-01-03 ENCOUNTER — Encounter: Payer: Self-pay | Admitting: Cardiology

## 2024-01-03 ENCOUNTER — Ambulatory Visit: Payer: Medicare Other | Attending: Cardiology | Admitting: Cardiology

## 2024-01-03 VITALS — BP 126/78 | HR 53 | Ht 75.0 in | Wt 199.2 lb

## 2024-01-03 DIAGNOSIS — I714 Abdominal aortic aneurysm, without rupture, unspecified: Secondary | ICD-10-CM | POA: Insufficient documentation

## 2024-01-03 DIAGNOSIS — Z0000 Encounter for general adult medical examination without abnormal findings: Secondary | ICD-10-CM | POA: Insufficient documentation

## 2024-01-03 DIAGNOSIS — I2510 Atherosclerotic heart disease of native coronary artery without angina pectoris: Secondary | ICD-10-CM | POA: Diagnosis not present

## 2024-01-03 DIAGNOSIS — E785 Hyperlipidemia, unspecified: Secondary | ICD-10-CM | POA: Insufficient documentation

## 2024-01-03 NOTE — Patient Instructions (Signed)
 Medication Instructions:  Continue same medications *If you need a refill on your cardiac medications before your next appointment, please call your pharmacy*  Lab Work: None ordered  Testing/Procedures: None ordered  Follow-Up: At [facility], you and your health needs are our priority.  As part of our continuing mission to provide you with exceptional heart care, our providers are all part of one team.  This team includes your primary Cardiologist (physician) and Advanced Practice Providers or APPs (Physician Assistants and Nurse Practitioners) who all work together to provide you with the care you need, when you need it.  Your next appointment:  1 year   Call in Jan to schedule May appointment    Provider:  Dr.Schumann   We recommend signing up for the patient portal called "MyChart".  Sign up information is provided on this After Visit Summary.  MyChart is used to connect with patients for Virtual Visits (Telemedicine).  Patients are able to view lab/test results, encounter notes, upcoming appointments, etc.  Non-urgent messages can be sent to your provider as well.   To learn more about what you can do with MyChart, go to [email or web address].

## 2024-01-21 ENCOUNTER — Other Ambulatory Visit: Payer: Self-pay | Admitting: Physician Assistant

## 2024-01-23 DIAGNOSIS — I700 Atherosclerosis of aorta: Secondary | ICD-10-CM | POA: Diagnosis not present

## 2024-01-23 DIAGNOSIS — I701 Atherosclerosis of renal artery: Secondary | ICD-10-CM | POA: Diagnosis not present

## 2024-01-23 DIAGNOSIS — I2510 Atherosclerotic heart disease of native coronary artery without angina pectoris: Secondary | ICD-10-CM | POA: Diagnosis not present

## 2024-02-01 DIAGNOSIS — I700 Atherosclerosis of aorta: Secondary | ICD-10-CM | POA: Diagnosis not present

## 2024-02-01 DIAGNOSIS — I2510 Atherosclerotic heart disease of native coronary artery without angina pectoris: Secondary | ICD-10-CM | POA: Diagnosis not present

## 2024-02-01 DIAGNOSIS — I701 Atherosclerosis of renal artery: Secondary | ICD-10-CM | POA: Diagnosis not present

## 2024-02-01 DIAGNOSIS — E7800 Pure hypercholesterolemia, unspecified: Secondary | ICD-10-CM | POA: Diagnosis not present

## 2024-02-10 DIAGNOSIS — G4733 Obstructive sleep apnea (adult) (pediatric): Secondary | ICD-10-CM | POA: Diagnosis not present

## 2024-02-18 ENCOUNTER — Other Ambulatory Visit: Payer: Self-pay | Admitting: Physician Assistant

## 2024-02-18 ENCOUNTER — Telehealth: Payer: Self-pay | Admitting: Physician Assistant

## 2024-02-18 MED ORDER — MEMANTINE HCL 5 MG PO TABS: ORAL_TABLET | ORAL | 11 refills | Status: DC

## 2024-02-18 NOTE — Telephone Encounter (Signed)
 Pt.s wife cld made f/u and needs refill of Rx MEMANTINE  at [facility], please call when completed

## 2024-02-19 DIAGNOSIS — G4733 Obstructive sleep apnea (adult) (pediatric): Secondary | ICD-10-CM | POA: Diagnosis not present

## 2024-02-20 ENCOUNTER — Encounter: Payer: Self-pay | Admitting: Physician Assistant

## 2024-02-20 ENCOUNTER — Ambulatory Visit (INDEPENDENT_AMBULATORY_CARE_PROVIDER_SITE_OTHER): Admitting: Physician Assistant

## 2024-02-20 VITALS — BP 106/59 | HR 63 | Resp 20 | Ht 75.0 in | Wt 200.0 lb

## 2024-02-20 DIAGNOSIS — G3184 Mild cognitive impairment, so stated: Secondary | ICD-10-CM

## 2024-02-20 MED ORDER — MEMANTINE HCL 10 MG PO TABS: ORAL_TABLET | ORAL | 3 refills | Status: DC

## 2024-02-20 NOTE — Patient Instructions (Addendum)
 It was a pleasure to see you today at our office.   Recommendations:  Follow up in  6 months Recommend B12 and B1 replenishment daily, follow with PCP Continue memantine  10 mg tablets twice daily.     Follow-up OSA    Whom to call:  Memory  decline, memory medications: Call our office [phone]   For psychiatric meds, mood meds: Please have your primary care physician manage these medications.   Counseling regarding caregiver distress, including caregiver depression, anxiety and issues regarding community resources, adult day care programs, adult living facilities, or memory care questions:   Feel free to contact Misty Court Distance, Social Worker at [phone]   For assessment of decision of mental capacity and competency:  Call Dr. Laverne Potter, geriatric psychiatrist at [phone]  If you have any severe symptoms of a stroke, or other severe issues such as confusion,severe chills or fever, etc call 911 or go to the ER as you may need to be evaluated further     Feel free to go to the following database for funded clinical studies conducted around the world: RankChecks.se   https://www.triadclinicaltrials.com/     RECOMMENDATIONS FOR ALL PATIENTS WITH MEMORY PROBLEMS: 1. Continue to exercise (Recommend 30 minutes of walking everyday, or 3 hours every week) 2. Increase social interactions - continue going to [place] and enjoy social gatherings with friends and family 3. Eat healthy, avoid fried foods and eat more fruits and vegetables 4. Maintain adequate blood pressure, blood sugar, and blood cholesterol level. Reducing the risk of stroke and cardiovascular disease also helps promoting better memory. 5. Avoid stressful situations. Live a simple life and avoid aggravations. Organize your time and prepare for the next day in anticipation. 6. Sleep well, avoid any interruptions of sleep and avoid any distractions in the bedroom that may interfere with  adequate sleep quality 7. Avoid sugar, avoid sweets as there is a strong link between excessive sugar intake, diabetes, and cognitive impairment We discussed the Mediterranean diet, which has been shown to help patients reduce the risk of progressive memory disorders and reduces cardiovascular risk. This includes eating fish, eat fruits and green leafy vegetables, nuts like almonds and hazelnuts, walnuts, and also use olive oil. Avoid fast foods and fried foods as much as possible. Avoid sweets and sugar as sugar use has been linked to worsening of memory function.  There is always a concern of gradual progression of memory problems. If this is the case, then we may need to adjust level of care according to patient needs. Support, both to the patient and caregiver, should then be put into place.    FALL PRECAUTIONS: Be cautious when walking. Scan the area for obstacles that may increase the risk of trips and falls. When getting up in the mornings, sit up at the edge of the bed for a few minutes before getting out of bed. Consider elevating the bed at the head end to avoid drop of blood pressure when getting up. Walk always in a well-lit room (use night lights in the walls). Avoid area rugs or power cords from appliances in the middle of the walkways. Use a walker or a cane if necessary and consider physical therapy for balance exercise. Get your eyesight checked regularly.  FINANCIAL OVERSIGHT: Supervision, especially oversight when making financial decisions or transactions is also recommended.  HOME SAFETY: Consider the safety of the kitchen when operating appliances like stoves, microwave oven, and blender. Consider having supervision and share cooking responsibilities until  no longer able to participate in those. Accidents with firearms and other hazards in the house should be identified and addressed as well.   ABILITY TO BE LEFT ALONE: If patient is unable to contact 911 operator, consider using  LifeLine, or when the need is there, arrange for someone to stay with patients. Smoking is a fire hazard, consider supervision or cessation. Risk of wandering should be assessed by caregiver and if detected at any point, supervision and safe proof recommendations should be instituted.  MEDICATION SUPERVISION: Inability to self-administer medication needs to be constantly addressed. Implement a mechanism to ensure safe administration of the medications.   DRIVING: Regarding driving, in patients with progressive memory problems, driving will be impaired. We advise to have someone else do the driving if trouble finding directions or if minor accidents are reported. Independent driving assessment is available to determine safety of driving.   If you are interested in the driving assessment, you can contact the following:  [employer] in [place] [phone]  [facility] [phone]  [facility] [phone]  [facility] [phone] or [phone]

## 2024-02-20 NOTE — Progress Notes (Signed)
 Assessment/Plan:    Amnestic mild cognitive impairment   Raymond Robinson is a very pleasant 68 y.o. RH male with a history of B hearing loss , OSA non on CPAP, CAD, AAA, PVD,  history of known lung nodule on surveillance,  amnestic MCI with concerns for Alzheimer's disease as per neuropsychological evaluation 02/2023  presenting today in follow-up for evaluation of memory loss. Patient is on memantine  5 mg twice daily (bradycardia with donepezil ).  MMSE is 22/30. He is able to participate on his ADLs and to drive without difficulty. Discussed increasing memantine  to 10 mg bid for better coverage, patient agrees.  .    Recommendations:   Follow up in 6  months. Continue memantine  increase to 10 mg twice daily, side effects discussed Patient to restart OSA  Continue B12 and B1 supplementation Recommend good control of cardiovascular risk factors Continue to control mood as per PCP    Subjective:   This patient is accompanied in the office by his wife  who supplements the history. Previous records as well as any outside records available were reviewed prior to todays visit.   Patient was last seen on June 2024 via video visit. .    Any changes in memory since last visit? About the same.  He is able to recall conversations and participates on them, and to recall names.  Enjoys playing solitaire on the computer, word finding, Jeopardy. He may struggle with fixing stuff that it was easy for him to do in the past repeats oneself?  Endorsed by his wife Disoriented when walking into a room?  Patient denies   Misplacing objects?  Patient denies   Wandering behavior?   Denies. Any personality changes since last visit?  Sometimes he is anxious and stressed regarding his memory Any worsening depression?: denies.   Hallucinations or paranoia?  Denies.   Seizures?   Denies.    Any sleep changes?  Denies vivid dreams, REM behavior or sleepwalking   Sleep apnea?  He gets up a lot, may never  feel rested .Does not wear his CPAP but he is having a new fitting so that he can use it    Any hygiene concerns?   Denies.   Independent of bathing and dressing?  Endorsed  Does the patient needs help with medications? Patient is in charge, uses a pillbox  Who is in charge of the finances?  Wife is in charge     Any changes in appetite?  Has found his appetite     Patient have trouble swallowing?  Denies.   Does the patient cook?  Any kitchen accidents such as leaving the stove on?   Denies.   Any headaches?    Denies.   Vision changes? Denies. Chronic pain?  Denies.   Ambulates with difficulty?   He does Pilates 2 times a week.   Recent falls or head injuries?    Denies.      Unilateral weakness, numbness or tingling?  Denies.   Any tremors?  Denies.   Any anosmia?    Denies.   Any incontinence of urine?  Denies.   Any bowel dysfunction?  Denies.      Patient lives with his wife.  Does the patient drive?  Drives short distance, uses GPS, denies getting lost.  Alcohol?  He reports drinking 2 beers a weekend  MRI of the brain from 06/03/2023, personally reviewed, remarkable for mild chronic small vessel ischemia, similar to October 2022 film, mild generalized cerebral  atrophy, mild right maxillary sinus mucosal thickening, no evidence of acute intracranial abnormality    Neuropsychological evaluation 02/2023 Briefly, results suggested severe impairment surrounding all aspects of verbal memory. Additionally severe impairments were exhibited across complex attention, executive functioning, and visuospatial abilities. Processing speed was mildly variable but overall far below expectation. Relative to his previous evaluation in March 2023, fairly notable decline was observed across complex attention, executive functioning, visuospatial abilities. More mild decline was exhibited across verbal memory, particularly recognition/consolidation aspects of memory testing. Other assessed domains were  largely stable. Regarding etiology, I continue to have concerns surrounding an underlying neurodegenerative illness. Raymond Robinson' memory patterns continue to be worrisome for Alzheimer's disease as he was fully amnestic (i.e., 0% retention) across all three verbal tasks after a brief delay and performed very poorly across yes/no recognition trials. This continues to suggest rapid forgetting and a fairly prominent storage impairment, both of which are the hallmark characteristics of this illness. Continued appropriate performances across semantic fluency and confrontation naming tasks remains encouraging from the perspective of traditional Alzheimer's disease patterns. His prominent decline in other cognitive domains, particularly visuospatial abilities, could elevate concerns surrounding an atypical Alzheimer's disease presentation (e.g., posterior cortical atrophy). Progressive decline was observed despite a fairly significant reduction in alcohol consumption. I continue to believe that his history of alcohol use/abuse serves to worsen deficits due to a neurodegenerative illness rather than account for all dysfunction.    Initial visit 06/22/2021   The patient is seen in neurologic consultation at the request of Koirala, Dibas, MD for the evaluation of memory.  The patient is here alone. 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  He lives with his wife, who noticed these, and encouraged him to seek medical attention.  He states that his memory perhaps was worsened in June of this year until 3 weeks ago, when he was overseeing renovations to his house, which he reports was a stressful time.  He also states that COVID pandemic affected his mood and increased the anxiety, as he could not do the things he wanted to.  He denies  a history of depression.  On his free time, he likes to play solitaire and other computer games.  He sleeps well, denies any vivid dreams or sleepwalking, hallucinations or paranoia.  He denies leaving objects in unusual places, but he continues to me splays his keys in glasses, for which his wife 3 weeks ago boarding and monitors to keep all the stuff in one place .  He denies any assistance with bathing and dressing.  He is in charge of his medications and denies missing any doses.  He uses a pillbox.  His wife, who is a IT trainer, has always been doing the finances.  His appetite is good, denies trouble swallowing.  He does not cook very frequently, and when he does so, denies leaving the stove or the faucet on.  He ambulates without difficulty without the use of a walker or a cane, denies any fall or recent head injuries.  In 1988, he [place] [address].  No other injuries since then.  He drives without GPS unless he goes to unfamiliar places or out of state.  He denies any headaches, double vision, dizziness, focal numbness or tingling, unilateral weakness or tremors, urine  incontinence or retention, constipation or diarrhea.  He denies anosmia.  He denies a history of alcohol.  He smokes about half pack a day, decreased from 6 years ago when he was at 1-1/2 packs/day.  He is planning to quit.  He has a prior history of sleep apnea, had a sleep study at the time, did not like the CPAP, so he lost weight and his symptoms improved.  Family history remarkable for dementia in mother.      Prior medications: donepezil   (bradycardia)  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   Aneurysm of iliac artery 07/06/2020   Asthma     Atherosclerosis of renal artery    Atherosclerotic heart disease of native coronary artery without angina pectoris    Bacterial sinusitis 02/27/2022   ETD (Eustachian tube dysfunction), bilateral 01/11/2017   Hiatal hernia    History of alcohol abuse    Hyperlipidemia    Obstructive sleep apnea    no CPAP use   Personal history of colonic polyps 07/06/2020   Pilonidal cyst    Pure hypercholesterolemia    Renal artery stenosis 07/06/2020   Seasonal allergic rhinitis 01/11/2017   Sensorineural hearing loss (SNHL) of both ears 02/22/2017   Tinnitus, bilateral 02/22/2017     Past Surgical History:  Procedure Laterality Date   JOINT REPLACEMENT Right 2005   Knee   KNEE SURGERY     right    CURRENT MEDICATIONS:  Outpatient Encounter Medications as of 02/20/2024  Medication Sig   acetaminophen  (TYLENOL ) 500 MG tablet Take 1,000 mg by mouth every 6 (six) hours as needed for mild pain, moderate pain, fever or headache.   alendronate (FOSAMAX) 70 MG tablet    aspirin  EC 81 MG tablet Take 1 tablet (81 mg total) by mouth daily. Swallow whole.   atorvastatin (LIPITOR) 20 MG tablet Take 20 mg by mouth at bedtime.   clotrimazole-betamethasone (LOTRISONE) cream    fluticasone (FLONASE) 50 MCG/ACT nasal spray Place into the nose.   valACYclovir (VALTREX) 1000 MG tablet Take by mouth.   [DISCONTINUED] memantine  (NAMENDA ) 5 MG tablet TAKE 1 TABLET  2 TIMES A DAY   memantine  (NAMENDA ) 10 MG tablet TAKE 1 TABLET  2 TIMES A DAY   No facility-administered encounter medications on file as of 02/20/2024.     Objective:     PHYSICAL EXAMINATION:    VITALS:   Vitals:   02/20/24 1252  BP: (!) 106/59  Pulse: 63  Resp: 20  SpO2: 95%  Weight: 200 lb (90.7 kg)  Height: 6' 3 (1.905 m)    GEN:  The patient appears stated age and is in NAD. HEENT:  Normocephalic, atraumatic.   Neurological examination:  General: NAD, well-groomed, appears stated age. Orientation: The patient is alert.  Oriented to person, place and not to date.  Cranial nerves: There is good facial symmetry.The speech is fluent and clear. No aphasia or dysarthria. Fund of knowledge is appropriate. Recent memory impaired and remote memory is normal.  Attention and concentration are normal.  Able to name objects and repeat phrases.  Hearing is decreased to conversational tone.   Delayed recall 0/3 Sensation: Sensation is intact to light touch throughout Motor: Strength is at least antigravity x4. DTR's 2/4  in UE/LE      06/05/2021   12:00 PM  Montreal Cognitive Assessment   Visuospatial/ Executive (0/5) 1  Naming (0/3) 3  Attention: Read list of digits (0/2) 2  Attention: Read list of letters (0/1) 1  Attention: Serial 7 subtraction starting at 100 (0/3) 3  Language: Repeat phrase (0/2) 2  Language : Fluency (0/1) 1  Abstraction (0/2) 2  Delayed Recall (0/5) 0  Orientation (0/6) 5  Total 20  Adjusted Score (based on education) 20       02/20/2024    5:00 PM 06/06/2022    9:00 AM  MMSE - Mini Mental State Exam  Orientation to time 2 5  Orientation to Place 4 5  Registration 3 0  Attention/ Calculation 5 5  Recall 0 3  Language- name 2 objects 2 2  Language- repeat 1 1  Language- follow 3 step command 3 1  Language- read & follow direction 1 1  Write a sentence 1 1  Copy design 0 0  Total score 22 24       Movement examination: Tone: There is normal tone in the UE/LE Abnormal movements:  no tremor.  No myoclonus.  No asterixis.   Coordination:  There is no decremation with RAM's. Normal finger to nose  Gait and Station: The patient has no difficulty arising out of a deep-seated chair without the use of the hands. The patient's stride length is good.  Gait is cautious and narrow.   Thank you for allowing us  the opportunity to participate in the care of this nice patient. Please do not hesitate to contact us  for any questions or concerns.   Total time spent on today's visit was 24 minutes  dedicated to this patient today, preparing to see patient, examining the patient, ordering tests and/or medications and counseling the patient, documenting clinical information in the EHR or other health record, independently interpreting results and communicating results to the patient/family, discussing treatment and goals, answering patient's questions and coordinating care.  Cc:  Lanae Pinal, MD  Tex Filbert 02/20/2024 5:47 PM

## 2024-02-22 DIAGNOSIS — I700 Atherosclerosis of aorta: Secondary | ICD-10-CM | POA: Diagnosis not present

## 2024-02-22 DIAGNOSIS — I2510 Atherosclerotic heart disease of native coronary artery without angina pectoris: Secondary | ICD-10-CM | POA: Diagnosis not present

## 2024-02-22 DIAGNOSIS — I701 Atherosclerosis of renal artery: Secondary | ICD-10-CM | POA: Diagnosis not present

## 2024-02-24 NOTE — Telephone Encounter (Signed)
 Pt wife left a message on the VM stating that we needed to call in the Memantine  to [facility] at [address]    The pharmacy has not gotten anything from us 

## 2024-02-25 ENCOUNTER — Other Ambulatory Visit: Payer: Self-pay

## 2024-02-25 MED ORDER — MEMANTINE HCL 10 MG PO TABS: ORAL_TABLET | ORAL | 3 refills | Status: AC

## 2024-02-25 NOTE — Telephone Encounter (Signed)
 I resent rx to pharmacy

## 2024-03-01 ENCOUNTER — Other Ambulatory Visit: Payer: Self-pay | Admitting: Physician Assistant

## 2024-03-02 DIAGNOSIS — I2510 Atherosclerotic heart disease of native coronary artery without angina pectoris: Secondary | ICD-10-CM | POA: Diagnosis not present

## 2024-03-02 DIAGNOSIS — E7800 Pure hypercholesterolemia, unspecified: Secondary | ICD-10-CM | POA: Diagnosis not present

## 2024-03-23 DIAGNOSIS — I701 Atherosclerosis of renal artery: Secondary | ICD-10-CM | POA: Diagnosis not present

## 2024-03-23 DIAGNOSIS — I2510 Atherosclerotic heart disease of native coronary artery without angina pectoris: Secondary | ICD-10-CM | POA: Diagnosis not present

## 2024-03-23 DIAGNOSIS — I700 Atherosclerosis of aorta: Secondary | ICD-10-CM | POA: Diagnosis not present

## 2024-04-02 DIAGNOSIS — I701 Atherosclerosis of renal artery: Secondary | ICD-10-CM | POA: Diagnosis not present

## 2024-04-02 DIAGNOSIS — I700 Atherosclerosis of aorta: Secondary | ICD-10-CM | POA: Diagnosis not present

## 2024-04-02 DIAGNOSIS — I2510 Atherosclerotic heart disease of native coronary artery without angina pectoris: Secondary | ICD-10-CM | POA: Diagnosis not present

## 2024-04-22 DIAGNOSIS — I700 Atherosclerosis of aorta: Secondary | ICD-10-CM | POA: Diagnosis not present

## 2024-04-22 DIAGNOSIS — I701 Atherosclerosis of renal artery: Secondary | ICD-10-CM | POA: Diagnosis not present

## 2024-04-22 DIAGNOSIS — I2510 Atherosclerotic heart disease of native coronary artery without angina pectoris: Secondary | ICD-10-CM | POA: Diagnosis not present

## 2024-05-03 DIAGNOSIS — E7800 Pure hypercholesterolemia, unspecified: Secondary | ICD-10-CM | POA: Diagnosis not present

## 2024-05-03 DIAGNOSIS — I700 Atherosclerosis of aorta: Secondary | ICD-10-CM | POA: Diagnosis not present

## 2024-05-03 DIAGNOSIS — I2510 Atherosclerotic heart disease of native coronary artery without angina pectoris: Secondary | ICD-10-CM | POA: Diagnosis not present

## 2024-05-03 DIAGNOSIS — I701 Atherosclerosis of renal artery: Secondary | ICD-10-CM | POA: Diagnosis not present

## 2024-05-22 DIAGNOSIS — I700 Atherosclerosis of aorta: Secondary | ICD-10-CM | POA: Diagnosis not present

## 2024-05-22 DIAGNOSIS — I2510 Atherosclerotic heart disease of native coronary artery without angina pectoris: Secondary | ICD-10-CM | POA: Diagnosis not present

## 2024-05-22 DIAGNOSIS — I701 Atherosclerosis of renal artery: Secondary | ICD-10-CM | POA: Diagnosis not present

## 2024-05-27 DIAGNOSIS — G4733 Obstructive sleep apnea (adult) (pediatric): Secondary | ICD-10-CM | POA: Diagnosis not present

## 2024-06-02 DIAGNOSIS — I700 Atherosclerosis of aorta: Secondary | ICD-10-CM | POA: Diagnosis not present

## 2024-06-02 DIAGNOSIS — E7800 Pure hypercholesterolemia, unspecified: Secondary | ICD-10-CM | POA: Diagnosis not present

## 2024-06-02 DIAGNOSIS — I701 Atherosclerosis of renal artery: Secondary | ICD-10-CM | POA: Diagnosis not present

## 2024-06-02 DIAGNOSIS — I2510 Atherosclerotic heart disease of native coronary artery without angina pectoris: Secondary | ICD-10-CM | POA: Diagnosis not present

## 2024-06-09 DIAGNOSIS — Z23 Encounter for immunization: Secondary | ICD-10-CM | POA: Diagnosis not present

## 2024-06-17 DIAGNOSIS — Z79899 Other long term (current) drug therapy: Secondary | ICD-10-CM | POA: Diagnosis not present

## 2024-06-17 DIAGNOSIS — E7800 Pure hypercholesterolemia, unspecified: Secondary | ICD-10-CM | POA: Diagnosis not present

## 2024-06-21 DIAGNOSIS — I2510 Atherosclerotic heart disease of native coronary artery without angina pectoris: Secondary | ICD-10-CM | POA: Diagnosis not present

## 2024-06-21 DIAGNOSIS — I700 Atherosclerosis of aorta: Secondary | ICD-10-CM | POA: Diagnosis not present

## 2024-06-21 DIAGNOSIS — I701 Atherosclerosis of renal artery: Secondary | ICD-10-CM | POA: Diagnosis not present

## 2024-06-22 DIAGNOSIS — M810 Age-related osteoporosis without current pathological fracture: Secondary | ICD-10-CM | POA: Diagnosis not present

## 2024-06-22 DIAGNOSIS — I7143 Infrarenal abdominal aortic aneurysm, without rupture: Secondary | ICD-10-CM | POA: Diagnosis not present

## 2024-06-22 DIAGNOSIS — I700 Atherosclerosis of aorta: Secondary | ICD-10-CM | POA: Diagnosis not present

## 2024-06-22 DIAGNOSIS — I2510 Atherosclerotic heart disease of native coronary artery without angina pectoris: Secondary | ICD-10-CM | POA: Diagnosis not present

## 2024-06-22 DIAGNOSIS — F03A0 Unspecified dementia, mild, without behavioral disturbance, psychotic disturbance, mood disturbance, and anxiety: Secondary | ICD-10-CM | POA: Diagnosis not present

## 2024-06-22 DIAGNOSIS — I701 Atherosclerosis of renal artery: Secondary | ICD-10-CM | POA: Diagnosis not present

## 2024-06-22 DIAGNOSIS — I723 Aneurysm of iliac artery: Secondary | ICD-10-CM | POA: Diagnosis not present

## 2024-06-22 DIAGNOSIS — E7800 Pure hypercholesterolemia, unspecified: Secondary | ICD-10-CM | POA: Diagnosis not present

## 2024-06-22 DIAGNOSIS — Z0000 Encounter for general adult medical examination without abnormal findings: Secondary | ICD-10-CM | POA: Diagnosis not present

## 2024-06-22 DIAGNOSIS — Z1331 Encounter for screening for depression: Secondary | ICD-10-CM | POA: Diagnosis not present

## 2024-06-22 DIAGNOSIS — Z79899 Other long term (current) drug therapy: Secondary | ICD-10-CM | POA: Diagnosis not present

## 2024-06-25 ENCOUNTER — Other Ambulatory Visit: Payer: Self-pay | Admitting: *Deleted

## 2024-06-25 DIAGNOSIS — I7143 Infrarenal abdominal aortic aneurysm, without rupture: Secondary | ICD-10-CM

## 2024-07-03 DIAGNOSIS — I701 Atherosclerosis of renal artery: Secondary | ICD-10-CM | POA: Diagnosis not present

## 2024-07-03 DIAGNOSIS — E7800 Pure hypercholesterolemia, unspecified: Secondary | ICD-10-CM | POA: Diagnosis not present

## 2024-07-03 DIAGNOSIS — I700 Atherosclerosis of aorta: Secondary | ICD-10-CM | POA: Diagnosis not present

## 2024-07-03 DIAGNOSIS — I2510 Atherosclerotic heart disease of native coronary artery without angina pectoris: Secondary | ICD-10-CM | POA: Diagnosis not present

## 2024-07-05 ENCOUNTER — Other Ambulatory Visit: Payer: Self-pay | Admitting: Family Medicine

## 2024-07-05 DIAGNOSIS — M810 Age-related osteoporosis without current pathological fracture: Secondary | ICD-10-CM

## 2024-07-15 ENCOUNTER — Encounter: Payer: Self-pay | Admitting: Vascular Surgery

## 2024-07-15 ENCOUNTER — Ambulatory Visit (INDEPENDENT_AMBULATORY_CARE_PROVIDER_SITE_OTHER): Admitting: Vascular Surgery

## 2024-07-15 ENCOUNTER — Ambulatory Visit (HOSPITAL_COMMUNITY)
Admission: RE | Admit: 2024-07-15 | Discharge: 2024-07-15 | Disposition: A | Discharge status: Home / Self Care | Source: Ambulatory Visit | Attending: Surgery | Admitting: Surgery

## 2024-07-15 VITALS — BP 125/70 | HR 50 | Temp 97.9°F | Resp 18 | Ht 75.0 in | Wt 200.4 lb

## 2024-07-15 DIAGNOSIS — I7143 Infrarenal abdominal aortic aneurysm, without rupture: Secondary | ICD-10-CM | POA: Insufficient documentation

## 2024-07-15 DIAGNOSIS — I723 Aneurysm of iliac artery: Secondary | ICD-10-CM | POA: Insufficient documentation

## 2024-07-15 NOTE — Progress Notes (Signed)
 ASSESSMENT & PLAN:  68 y.o. male with small abdominal aortic aneurysm (43mm), left common iliac artery aneurysm (36mm), right common iliac artery aneurysm (21mm).   LCIAA is at threshold for repair. I counseled about low rupture risk of this and recommended we repair this if it is any larger at next visit.  Recommend:  Abstinence from all tobacco products. Blood glucose control with goal A1c < 7%. Blood pressure control with goal blood pressure < 130/80 mmHg. Lipid reduction therapy with goal LDL-C < 55 mg/dL. Aspirin  81mg  by mouth daily. Atorvastatin 40-80mg  PO QD (or other high intensity statin therapy).  Follow up in 6 months with repeat AAA duplex.  CHIEF COMPLAINT:   Incidental discovery of aneurysm  HISTORY:  HISTORY OF PRESENT ILLNESS: Raymond Robinson is a 68 y.o. male referred to clinic for evaluation of incidental discovery of asymptomatic infrarenal abdominal aortic and bilateral common iliac artery aneurysms.  He is a retired therapist, [employer].  He is a lifelong smoker.  He is try to quit previously, but not had much success.  He is undergoing low-dose chest CT scanning for lung nodule surveillance.  He has no complaints today.  He has multiple excellent questions about surveillance and repair.  01/31/21: Doing very well.  No symptoms referable to his abdominal aortic aneurysm.  We again reviewed the natural history of aneurysm disease.  02/27/22: Doing very well.  No symptoms referable to his abdominal aortic aneurysm.  We again reviewed the natural history of aneurysm disease. Staying busy with projects around the house and with grandchildren.  06/18/23: Doing very well.  No symptoms referable to his abdominal aortic aneurysm.  We again reviewed the natural history of aneurysm disease.  07/15/24: Here with his wife today. Many excellent questions as usual. We reviewed duplex. I counseled that his iliac artery is barely above the threshold for repair,  but that these typically do not rupture even above 4cm. We agreed to watch this closely and repair if it is above threshold at our next visit.  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   Aneurysm of iliac artery 07/06/2020   Asthma    Atherosclerosis of renal artery    Atherosclerotic heart disease of native coronary artery without angina pectoris    Bacterial sinusitis 02/27/2022   ETD (Eustachian tube dysfunction), bilateral 01/11/2017   Hiatal hernia    History of alcohol abuse    Hyperlipidemia    Obstructive sleep apnea    no CPAP use   Personal history of colonic polyps 07/06/2020   Pilonidal cyst    Pure hypercholesterolemia    Renal artery stenosis 07/06/2020   Seasonal allergic rhinitis 01/11/2017   Sensorineural hearing loss (SNHL) of both ears 02/22/2017   Tinnitus, bilateral 02/22/2017    Past Surgical History:  Procedure Laterality Date   JOINT REPLACEMENT Right 2005   Knee   KNEE SURGERY     right    Family History  Problem Relation Age of Onset   Dementia Paternal Grandfather        wandering behaviors   Memory loss Maternal Aunt     Social History   Socioeconomic History   Marital status: Married    Spouse name:  Not on file   Number of children: 3   Years of education: 16   Highest education level: Bachelor's degree (e.g., BA, AB, BS)  Occupational History   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  Tobacco Use   Smoking status: Former    Current packs/day: 0.00    Average packs/day: 0.5 packs/day for 55.4 years (27.7 ttl pk-yrs)    Types: Cigarettes    Start date: 10/03/1966    Quit date: 02/12/2022    Years since quitting: 2.4    Passive exposure: Never   Smokeless tobacco:  Never  Vaping Use   Vaping status: Never Used  Substance and Sexual Activity   Alcohol use: Yes    Alcohol/week: 10.0 standard drinks of alcohol    Types: 10 Cans of beer per week    Comment: 2 beers per night on average, 5 nights per week   Drug use: No   Sexual activity: Not on file  Other Topics Concern   Not on file  Social History Narrative   ** Merged History Encounter **       Right handed One story home Drinks caffeine   Social Drivers of Corporate Investment Banker Strain: Not on file  Food Insecurity: Not on file  Transportation Needs: Not on file  Physical Activity: Not on file  Stress: Not on file  Social Connections: Not on file  Intimate Partner Violence: Not on file    Allergies  Allergen Reactions   Penicillins Other (See Comments)    Reaction:  Unknown  Has patient had a PCN reaction causing immediate rash, facial/tongue/throat swelling, SOB or lightheadedness with hypotension: Unsure Has patient had a PCN reaction causing severe rash involving mucus membranes or skin necrosis: Unsure Has patient had a PCN reaction that required hospitalization Unsure Has patient had a PCN reaction occurring within the last 10 years: No If all of the above answers are NO, then may proceed with Cephalosporin use. Reaction:  Unknown  Has patient had a PCN reaction causing immediate rash, facial/tongue/throat swelling, SOB or lightheadedness with hypotension: Unsure Has patient had a PCN reaction causing severe rash involving mucus membranes or skin necrosis: Unsure Has patient had a PCN reaction that required hospitalization Unsure Has patient had a PCN reaction occurring within the last 10 years: No If all of the above answers are NO, then may proceed with Cephalosporin use.    Current Outpatient Medications  Medication Sig Dispense Refill   alendronate (FOSAMAX) 70 MG tablet      aspirin  EC 81 MG tablet Take 1 tablet (81 mg total) by mouth daily. Swallow whole.  90 tablet 3   atorvastatin (LIPITOR) 20 MG tablet Take 20 mg by mouth at bedtime.     memantine  (NAMENDA ) 10 MG tablet TAKE 1 TABLET  2 TIMES A DAY 180 tablet 3   valACYclovir (VALTREX) 1000 MG tablet Take by mouth.     No current facility-administered medications for this visit.   PHYSICAL EXAM:   Vitals:   07/15/24 0902  BP: 125/70  Pulse: (!) 50  Resp: 18  Temp: 97.9 F (36.6 C)  TempSrc: Temporal  SpO2: 98%  Weight: 200 lb 6.4 oz (90.9 kg)  Height: 6' 3 (1.905 m)  Healthy elderly man in no distress Regular rate and rhythm Unlabored breathing 2+ DP pulses  DATA REVIEW:    Most recent CBC    Latest Ref Rng & Units 12/28/2016    6:23 PM  CBC  WBC 4.0 - 10.5 K/uL 14.2   Hemoglobin 13.0 - 17.0 g/dL 84.9   Hematocrit 60.9 - 52.0 % 41.2   Platelets 150 - 400 K/uL 252      Most recent CMP    Latest Ref Rng & Units 12/28/2016    6:23 PM  CMP  Glucose 65 - 99 mg/dL 92   BUN 6 - 20 mg/dL 15   Creatinine 9.38 - 1.24 mg/dL 9.20   Sodium 864 - 854 mmol/L 132   Potassium 3.5 - 5.1 mmol/L 4.1   Chloride 101 - 111 mmol/L 94   CO2 22 - 32 mmol/L 29   Calcium 8.9 - 10.3 mg/dL 9.6      Vascular Imaging: CTA personally reviewed Small infrarenal abdominal aortic aneurysm measuring 36mm in greatest dimension Left common iliac artery aneurysm measuring 27mm Right common iliac artery ectasia measuring 20mm Mild right renal artery stenosis Evidence of calcific atherosclerotic disease throughout  AAA duplex 06/18/23:   small abdominal aortic aneurysm (40mm), left common iliac artery aneurysm (30mm), right common iliac artery aneurysm (21mm).  AAA duplex 07/15/24: 43mm infrarenal AAA; 36mm LCIAA; 24mm RCIAA  Tyreak Reagle N. Magda, MD [facility] Phone Number: [phone] 07/15/2024 10:35 AM

## 2024-07-21 DIAGNOSIS — I2510 Atherosclerotic heart disease of native coronary artery without angina pectoris: Secondary | ICD-10-CM | POA: Diagnosis not present

## 2024-07-21 DIAGNOSIS — I700 Atherosclerosis of aorta: Secondary | ICD-10-CM | POA: Diagnosis not present

## 2024-07-21 DIAGNOSIS — I701 Atherosclerosis of renal artery: Secondary | ICD-10-CM | POA: Diagnosis not present

## 2024-08-02 DIAGNOSIS — I2510 Atherosclerotic heart disease of native coronary artery without angina pectoris: Secondary | ICD-10-CM | POA: Diagnosis not present

## 2024-08-02 DIAGNOSIS — I701 Atherosclerosis of renal artery: Secondary | ICD-10-CM | POA: Diagnosis not present

## 2024-08-02 DIAGNOSIS — E7800 Pure hypercholesterolemia, unspecified: Secondary | ICD-10-CM | POA: Diagnosis not present

## 2024-08-02 DIAGNOSIS — I700 Atherosclerosis of aorta: Secondary | ICD-10-CM | POA: Diagnosis not present

## 2024-08-09 NOTE — Progress Notes (Signed)
 Assessment & Plan  Amnestic MCI     Raymond Robinson is a very pleasant 68 y.o. RH male with a history of*** presenting today in follow-up for evaluation of memory loss. Patient is on ***. This patient is accompanied in the office by his wife ***  who supplements the history. Previous records as well as any outside records available were reviewed prior to todays visit.   Patient was last seen on  02/20/2024. Memory is ***. MMSE today is  /30.Patient is able to participate on ADLs and to to drive without difficulties. Mood is ***  Recommend good control of cardiovascular risk factors.    Continue to control mood as per PCP     Discussed the use of AI scribe software for clinical note transcription with the patient, who gave verbal consent to proceed.  History of Present Illness    Any changes in memory since last visit? About the same.  He is able to recall conversations and participates on them, and to recall names.  Enjoys playing solitaire on the computer, word finding, Jeopardy. He may struggle with fixing stuff that it was easy for him to do in the past repeats oneself?  Endorsed by his wife Disoriented when walking into a room?  Patient denies   Misplacing objects?  Patient denies   Wandering behavior?   Denies. Any personality changes since last visit?  Sometimes he is anxious and stressed regarding his memory Any worsening depression?: denies.   Hallucinations or paranoia?  Denies.   Seizures?   Denies.    Any sleep changes?  Denies vivid dreams, REM behavior or sleepwalking   Sleep apnea?  He gets up a lot, may never feel rested .Does not wear his CPAP but he is having a new fitting so that he can use it    Any hygiene concerns?   Denies.   Independent of bathing and dressing?  Endorsed  Does the patient needs help with medications? Patient is in charge, uses a pillbox  Who is in charge of the finances?  Wife is in charge     Any changes in appetite?  Has found his  appetite     Patient have trouble swallowing?  Denies.   Does the patient cook?  Any kitchen accidents such as leaving the stove on?   Denies.   Any headaches?    Denies.   Vision changes? Denies. Chronic pain?  Denies.   Ambulates with difficulty?   He does Pilates 2 times a week.   Recent falls or head injuries?    Denies.      Unilateral weakness, numbness or tingling?  Denies.   Any tremors?  Denies.   Any anosmia?    Denies.   Any incontinence of urine?  Denies.   Any bowel dysfunction?  Denies.      Patient lives with his wife.  Does the patient drive?  Drives short distance, uses GPS, denies getting lost.  Alcohol?  He reports drinking 2 beers a weekend   MRI of the brain from 06/03/2023, personally reviewed, remarkable for mild chronic small vessel ischemia, similar to October 2022 film, mild generalized cerebral atrophy, mild right maxillary sinus mucosal thickening, no evidence of acute intracranial abnormality     Neuropsychological evaluation 02/2023 Briefly, results suggested severe impairment surrounding all aspects of verbal memory. Additionally severe impairments were exhibited across complex attention, executive functioning, and visuospatial abilities. Processing speed was mildly variable but overall far below expectation.  Relative to his previous evaluation in March 2023, fairly notable decline was observed across complex attention, executive functioning, visuospatial abilities. More mild decline was exhibited across verbal memory, particularly recognition/consolidation aspects of memory testing. Other assessed domains were largely stable. Regarding etiology, I continue to have concerns surrounding an underlying neurodegenerative illness. [name]' memory patterns continue to be worrisome for Alzheimer's disease as he was fully amnestic (i.e., 0% retention) across all three verbal tasks after a brief delay and performed very poorly across yes/no recognition trials. This continues  to suggest rapid forgetting and a fairly prominent storage impairment, both of which are the hallmark characteristics of this illness. Continued appropriate performances across semantic fluency and confrontation naming tasks remains encouraging from the perspective of traditional Alzheimer's disease patterns. His prominent decline in other cognitive domains, particularly visuospatial abilities, could elevate concerns surrounding an atypical Alzheimer's disease presentation (e.g., posterior cortical atrophy). Progressive decline was observed despite a fairly significant reduction in alcohol consumption. I continue to believe that his history of alcohol use/abuse serves to worsen deficits due to a neurodegenerative illness rather than account for all dysfunction.    Initial visit 06/22/2021   The patient is seen in neurologic consultation at the request of Koirala, Dibas, MD for the evaluation of memory.  The patient is here alone. 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  He lives with his wife, who noticed these, and encouraged him to seek medical attention.  He states that his memory perhaps was worsened in June of this year until 3 weeks ago, when he was overseeing renovations to his house, which he reports was a stressful time.  He also states that COVID pandemic affected his mood and increased the anxiety, as he could not do the things he wanted to.  He denies a history of depression.  On his free time, he likes to play solitaire and other computer games.  He sleeps well, denies any vivid dreams or sleepwalking, hallucinations or paranoia.  He denies leaving objects in unusual places, but he continues to me splays his keys in glasses, for which his wife 3 weeks ago boarding and monitors to keep all the stuff in one  place .  He denies any assistance with bathing and dressing.  He is in charge of his medications and denies missing any doses.  He uses a pillbox.  His wife, who is a IT TRAINER, has always been doing the finances.  His appetite is good, denies trouble swallowing.  He does not cook very frequently, and when he does so, denies leaving the stove or the faucet on.  He ambulates without difficulty without the use of a walker or a cane, denies any fall or recent head injuries.  In 1988, he [place] [address].  No other injuries since then.  He drives without GPS unless he goes to unfamiliar places or out of state.  He denies any headaches, double vision, dizziness, focal numbness or tingling, unilateral weakness or tremors, urine incontinence or retention, constipation or diarrhea.  He denies anosmia.  He denies a history of alcohol.  He smokes about half pack a day, decreased from 6 years ago when he was at 1-1/2 packs/day.  He is planning to quit.  He has a prior history of sleep apnea, had a  sleep study at the time, did not like the CPAP, so he lost weight and his symptoms improved.  Family history remarkable for dementia in mother.      Prior medications: donepezil   (bradycardia)  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   Aneurysm of iliac artery 07/06/2020   Asthma    Atherosclerosis of renal artery    Atherosclerotic heart disease of native coronary artery without angina pectoris    Bacterial sinusitis 02/27/2022   ETD (Eustachian tube dysfunction), bilateral 01/11/2017   Hiatal hernia    History of alcohol abuse    Hyperlipidemia    Obstructive sleep apnea    no CPAP use   Personal history of colonic polyps 07/06/2020    Pilonidal cyst    Pure hypercholesterolemia    Renal artery stenosis 07/06/2020   Seasonal allergic rhinitis 01/11/2017   Sensorineural hearing loss (SNHL) of both ears 02/22/2017   Tinnitus, bilateral 02/22/2017     Past Surgical History:  Procedure Laterality Date   JOINT REPLACEMENT Right 2005   Knee   KNEE SURGERY     right      Results      Objective:     PHYSICAL EXAMINATION:    VITALS:  There were no vitals filed for this visit.  GEN:  The patient appears stated age and is in NAD. HEENT:  Normocephalic, atraumatic.   Neurological examination:  General: NAD, well-groomed, appears stated age. Orientation: The patient is alert. Oriented to person, place and not to date.*** Cranial nerves: There is good facial symmetry.The speech is fluent and clear. No aphasia or dysarthria. Fund of knowledge is appropriate. Recent memory impaired and remote memory is normal.  Attention and concentration are normal.  Able to name objects and repeat phrases.  Hearing is intact to conversational tone ***.   Delayed recall *** Sensation: Sensation is intact to light touch throughout Motor: Strength is at least antigravity x4. DTR's 2/4 in UE/LE      06/05/2021   12:00 PM  Montreal Cognitive Assessment   Visuospatial/ Executive (0/5) 1  Naming (0/3) 3  Attention: Read list of digits (0/2) 2  Attention: Read list of letters (0/1) 1  Attention: Serial 7 subtraction starting at 100 (0/3) 3  Language: Repeat phrase (0/2) 2  Language : Fluency (0/1) 1  Abstraction (0/2) 2  Delayed Recall (0/5) 0  Orientation (0/6) 5  Total 20  Adjusted Score (based on education) 20       02/20/2024    5:00 PM 06/06/2022    9:00 AM  MMSE - Mini Mental State Exam  Orientation to time 2 5  Orientation to Place 4 5  Registration 3 0  Attention/ Calculation 5 5  Recall 0 3  Language- name 2 objects 2 2  Language- repeat 1 1  Language- follow 3 step command 3 1  Language- read & follow  direction 1 1  Write a sentence 1 1  Copy design 0 0  Total score 22 24     Results     Movement examination: Tone: There is normal tone in the UE/LE Abnormal movements:  no tremor.  No myoclonus.  No asterixis.   Coordination:  There is no decremation with RAM's. Normal  finger to nose  Gait and Station: The patient has no difficulty arising out of a deep-seated chair without the use of the hands. The patient's stride length is good.  Gait is cautious and narrow.   Thank you for allowing us  the opportunity to participate in the care of this nice patient. Please do not hesitate to contact us  for any questions or concerns.   Total time spent on today's visit was *** minutes dedicated to this patient today, preparing to see patient, examining the patient, ordering tests and/or medications and counseling the patient, documenting clinical information in the EHR or other health record, independently interpreting results and communicating results to the patient/family, discussing treatment and goals, answering patient's questions and coordinating care.  Cc:  Regino Slater, MD  Camie Sevin 08/09/2024 1:31 PM

## 2024-08-10 ENCOUNTER — Ambulatory Visit (INDEPENDENT_AMBULATORY_CARE_PROVIDER_SITE_OTHER): Admitting: Physician Assistant

## 2024-08-10 VITALS — BP 122/74 | HR 76 | Resp 20 | Wt 204.0 lb

## 2024-08-10 DIAGNOSIS — G3184 Mild cognitive impairment, so stated: Secondary | ICD-10-CM

## 2024-08-10 NOTE — Patient Instructions (Signed)
 It was a pleasure to see you today at our office.   Recommendations:  Follow up in  6 months Continue memantine  10 mg tablets twice daily.     Continue using the CPAP   Whom to call:  Memory  decline, memory medications: Call our office [phone]   For psychiatric meds, mood meds: Please have your primary care physician manage these medications.   Counseling regarding caregiver distress, including caregiver depression, anxiety and issues regarding community resources, adult day care programs, adult living facilities, or memory care questions:   Feel free to contact Misty Waddell Simmer, Social Worker at [phone]   For assessment of decision of mental capacity and competency:  Call Dr. Rosaline Nine, geriatric psychiatrist at [phone]  If you have any severe symptoms of a stroke, or other severe issues such as confusion,severe chills or fever, etc call 911 or go to the ER as you may need to be evaluated further     Feel free to go to the following database for funded clinical studies conducted around the world: rankchecks.se   https://www.triadclinicaltrials.com/     RECOMMENDATIONS FOR ALL PATIENTS WITH MEMORY PROBLEMS: 1. Continue to exercise (Recommend 30 minutes of walking everyday, or 3 hours every week) 2. Increase social interactions - continue going to [place] and enjoy social gatherings with friends and family 3. Eat healthy, avoid fried foods and eat more fruits and vegetables 4. Maintain adequate blood pressure, blood sugar, and blood cholesterol level. Reducing the risk of stroke and cardiovascular disease also helps promoting better memory. 5. Avoid stressful situations. Live a simple life and avoid aggravations. Organize your time and prepare for the next day in anticipation. 6. Sleep well, avoid any interruptions of sleep and avoid any distractions in the bedroom that may interfere with adequate sleep quality 7. Avoid sugar, avoid sweets as  there is a strong link between excessive sugar intake, diabetes, and cognitive impairment We discussed the Mediterranean diet, which has been shown to help patients reduce the risk of progressive memory disorders and reduces cardiovascular risk. This includes eating fish, eat fruits and green leafy vegetables, nuts like almonds and hazelnuts, walnuts, and also use olive oil. Avoid fast foods and fried foods as much as possible. Avoid sweets and sugar as sugar use has been linked to worsening of memory function.  There is always a concern of gradual progression of memory problems. If this is the case, then we may need to adjust level of care according to patient needs. Support, both to the patient and caregiver, should then be put into place.    FALL PRECAUTIONS: Be cautious when walking. Scan the area for obstacles that may increase the risk of trips and falls. When getting up in the mornings, sit up at the edge of the bed for a few minutes before getting out of bed. Consider elevating the bed at the head end to avoid drop of blood pressure when getting up. Walk always in a well-lit room (use night lights in the walls). Avoid area rugs or power cords from appliances in the middle of the walkways. Use a walker or a cane if necessary and consider physical therapy for balance exercise. Get your eyesight checked regularly.  FINANCIAL OVERSIGHT: Supervision, especially oversight when making financial decisions or transactions is also recommended.  HOME SAFETY: Consider the safety of the kitchen when operating appliances like stoves, microwave oven, and blender. Consider having supervision and share cooking responsibilities until no longer able to participate in those. Accidents  with firearms and other hazards in the house should be identified and addressed as well.   ABILITY TO BE LEFT ALONE: If patient is unable to contact 911 operator, consider using Life[employer], or when the need is there, arrange for someone  to stay with patients. Smoking is a fire hazard, consider supervision or cessation. Risk of wandering should be assessed by caregiver and if detected at any point, supervision and safe proof recommendations should be instituted.  MEDICATION SUPERVISION: Inability to self-administer medication needs to be constantly addressed. Implement a mechanism to ensure safe administration of the medications.   DRIVING: Regarding driving, in patients with progressive memory problems, driving will be impaired. We advise to have someone else do the driving if trouble finding directions or if minor accidents are reported. Independent driving assessment is available to determine safety of driving.   If you are interested in the driving assessment, you can contact the following:  [facility] [employer] [facility] in [place] [phone]  [facility] [phone]  [facility] [phone]  [facility] [phone] or [phone]

## 2024-08-19 DIAGNOSIS — G4733 Obstructive sleep apnea (adult) (pediatric): Secondary | ICD-10-CM | POA: Diagnosis not present

## 2024-08-24 ENCOUNTER — Inpatient Hospital Stay: Admission: RE | Admit: 2024-08-24 | Discharge: 2024-08-24 | Attending: Acute Care | Admitting: Acute Care

## 2024-08-24 DIAGNOSIS — Z122 Encounter for screening for malignant neoplasm of respiratory organs: Secondary | ICD-10-CM

## 2024-08-24 DIAGNOSIS — Z87891 Personal history of nicotine dependence: Secondary | ICD-10-CM

## 2024-09-02 ENCOUNTER — Other Ambulatory Visit: Payer: Self-pay | Admitting: Acute Care

## 2024-09-02 DIAGNOSIS — Z122 Encounter for screening for malignant neoplasm of respiratory organs: Secondary | ICD-10-CM

## 2024-09-02 DIAGNOSIS — Z87891 Personal history of nicotine dependence: Secondary | ICD-10-CM

## 2025-01-04 ENCOUNTER — Other Ambulatory Visit (HOSPITAL_BASED_OUTPATIENT_CLINIC_OR_DEPARTMENT_OTHER)

## 2025-02-09 ENCOUNTER — Ambulatory Visit: Admitting: Physician Assistant
